# Patient Record
Sex: Female | Born: 1960 | Race: White | Hispanic: No | Smoking: Current every day smoker
Health system: Southern US, Community
[De-identification: ages and names within clinical notes are randomized; demographics above are authoritative.]

## PROBLEM LIST (undated history)

## (undated) DIAGNOSIS — M25519 Pain in unspecified shoulder: Secondary | ICD-10-CM

## (undated) DIAGNOSIS — G629 Polyneuropathy, unspecified: Secondary | ICD-10-CM

## (undated) DIAGNOSIS — M549 Dorsalgia, unspecified: Secondary | ICD-10-CM

## (undated) DIAGNOSIS — E7800 Pure hypercholesterolemia, unspecified: Secondary | ICD-10-CM

## (undated) DIAGNOSIS — R000 Tachycardia, unspecified: Secondary | ICD-10-CM

## (undated) DIAGNOSIS — G8929 Other chronic pain: Secondary | ICD-10-CM

## (undated) HISTORY — PX: SHOULDER SURGERY: SHX246

## (undated) HISTORY — PX: GALLBLADDER SURGERY: SHX652

## (undated) HISTORY — PX: ABDOMINAL HYSTERECTOMY: SHX81

## (undated) HISTORY — PX: BACK SURGERY: SHX140

## (undated) HISTORY — PX: ILIAC ARTERY STENT: SHX1786

---

## 2004-06-20 ENCOUNTER — Emergency Department: Payer: Self-pay | Admitting: Emergency Medicine

## 2004-06-28 ENCOUNTER — Emergency Department: Payer: Self-pay | Admitting: Emergency Medicine

## 2004-08-08 ENCOUNTER — Emergency Department: Payer: Self-pay | Admitting: Emergency Medicine

## 2004-12-15 ENCOUNTER — Emergency Department: Payer: Self-pay | Admitting: Emergency Medicine

## 2004-12-15 ENCOUNTER — Other Ambulatory Visit: Payer: Self-pay

## 2005-02-08 ENCOUNTER — Emergency Department: Payer: Self-pay | Admitting: Internal Medicine

## 2005-03-21 ENCOUNTER — Ambulatory Visit: Payer: Self-pay | Admitting: Unknown Physician Specialty

## 2005-05-04 ENCOUNTER — Other Ambulatory Visit: Payer: Self-pay

## 2005-05-11 ENCOUNTER — Inpatient Hospital Stay: Payer: Self-pay | Admitting: Unknown Physician Specialty

## 2005-05-15 ENCOUNTER — Other Ambulatory Visit: Payer: Self-pay

## 2006-04-08 ENCOUNTER — Emergency Department (HOSPITAL_COMMUNITY): Admission: EM | Admit: 2006-04-08 | Discharge: 2006-04-08 | Payer: Self-pay | Admitting: Emergency Medicine

## 2006-04-13 ENCOUNTER — Ambulatory Visit: Payer: Self-pay | Admitting: General Practice

## 2006-09-18 ENCOUNTER — Emergency Department: Payer: Self-pay | Admitting: Emergency Medicine

## 2006-09-26 ENCOUNTER — Ambulatory Visit: Payer: Self-pay | Admitting: Orthopaedic Surgery

## 2006-11-05 ENCOUNTER — Ambulatory Visit: Payer: Self-pay | Admitting: Unknown Physician Specialty

## 2006-12-19 ENCOUNTER — Ambulatory Visit: Payer: Self-pay | Admitting: Unknown Physician Specialty

## 2006-12-24 ENCOUNTER — Ambulatory Visit: Payer: Self-pay | Admitting: Unknown Physician Specialty

## 2007-06-14 ENCOUNTER — Ambulatory Visit: Payer: Self-pay | Admitting: Unknown Physician Specialty

## 2007-09-12 ENCOUNTER — Ambulatory Visit: Payer: Self-pay | Admitting: General Practice

## 2007-12-02 ENCOUNTER — Ambulatory Visit: Payer: Self-pay | Admitting: Unknown Physician Specialty

## 2008-03-23 ENCOUNTER — Ambulatory Visit: Payer: Self-pay | Admitting: Unknown Physician Specialty

## 2008-03-24 ENCOUNTER — Ambulatory Visit: Payer: Self-pay | Admitting: Unknown Physician Specialty

## 2008-05-28 ENCOUNTER — Ambulatory Visit (HOSPITAL_COMMUNITY): Admission: RE | Admit: 2008-05-28 | Discharge: 2008-05-28 | Payer: Self-pay | Admitting: Obstetrics & Gynecology

## 2008-06-29 ENCOUNTER — Emergency Department (HOSPITAL_COMMUNITY): Admission: EM | Admit: 2008-06-29 | Discharge: 2008-06-29 | Payer: Self-pay | Admitting: Emergency Medicine

## 2008-07-23 ENCOUNTER — Emergency Department (HOSPITAL_COMMUNITY): Admission: EM | Admit: 2008-07-23 | Discharge: 2008-07-23 | Payer: Self-pay | Admitting: Emergency Medicine

## 2008-09-03 ENCOUNTER — Emergency Department (HOSPITAL_COMMUNITY): Admission: EM | Admit: 2008-09-03 | Discharge: 2008-09-03 | Payer: Self-pay | Admitting: Emergency Medicine

## 2008-12-18 ENCOUNTER — Emergency Department: Payer: Self-pay | Admitting: Emergency Medicine

## 2009-01-11 ENCOUNTER — Ambulatory Visit: Payer: Self-pay | Admitting: Cardiology

## 2009-01-13 ENCOUNTER — Ambulatory Visit: Payer: Self-pay | Admitting: Unknown Physician Specialty

## 2009-02-10 ENCOUNTER — Ambulatory Visit: Payer: Self-pay | Admitting: Physician Assistant

## 2009-02-21 ENCOUNTER — Emergency Department: Payer: Self-pay | Admitting: Internal Medicine

## 2009-12-04 ENCOUNTER — Emergency Department (HOSPITAL_COMMUNITY): Admission: EM | Admit: 2009-12-04 | Discharge: 2009-12-04 | Payer: Self-pay | Admitting: Emergency Medicine

## 2010-04-03 ENCOUNTER — Encounter: Payer: Self-pay | Admitting: Obstetrics & Gynecology

## 2010-05-26 LAB — CULTURE, ROUTINE-ABSCESS

## 2010-06-16 ENCOUNTER — Emergency Department (HOSPITAL_COMMUNITY)
Admission: EM | Admit: 2010-06-16 | Discharge: 2010-06-16 | Disposition: A | Discharge status: Home / Self Care | Payer: Medicaid Other | Attending: Emergency Medicine | Admitting: Emergency Medicine

## 2010-06-16 DIAGNOSIS — E7800 Pure hypercholesterolemia, unspecified: Secondary | ICD-10-CM | POA: Insufficient documentation

## 2010-06-16 DIAGNOSIS — I10 Essential (primary) hypertension: Secondary | ICD-10-CM | POA: Insufficient documentation

## 2010-06-16 DIAGNOSIS — Z79899 Other long term (current) drug therapy: Secondary | ICD-10-CM | POA: Insufficient documentation

## 2010-06-16 DIAGNOSIS — E119 Type 2 diabetes mellitus without complications: Secondary | ICD-10-CM | POA: Insufficient documentation

## 2010-06-16 DIAGNOSIS — L03211 Cellulitis of face: Secondary | ICD-10-CM | POA: Insufficient documentation

## 2010-06-16 DIAGNOSIS — L0201 Cutaneous abscess of face: Secondary | ICD-10-CM | POA: Insufficient documentation

## 2010-06-16 DIAGNOSIS — K047 Periapical abscess without sinus: Secondary | ICD-10-CM | POA: Insufficient documentation

## 2010-06-16 LAB — DIFFERENTIAL
Basophils Absolute: 0 10*3/uL (ref 0.0–0.1)
Lymphs Abs: 3.3 10*3/uL (ref 0.7–4.0)
Neutrophils Relative %: 66 % (ref 43–77)

## 2010-06-16 LAB — CBC
MCV: 92.1 fL (ref 78.0–100.0)
RBC: 4.95 MIL/uL (ref 3.87–5.11)
RDW: 13.1 % (ref 11.5–15.5)

## 2010-06-16 LAB — BASIC METABOLIC PANEL
BUN: 10 mg/dL (ref 6–23)
CO2: 30 mEq/L (ref 19–32)
Calcium: 9.9 mg/dL (ref 8.4–10.5)
GFR calc Af Amer: >60 mL/min (ref >60)
GFR calc non Af Amer: >60 mL/min (ref >60)
Glucose, Bld: 336 mg/dL — ABNORMAL HIGH (ref 70–99)
Potassium: 4.6 mEq/L (ref 3.5–5.1)

## 2010-06-21 LAB — GLUCOSE, CAPILLARY: Glucose-Capillary: 248 mg/dL — ABNORMAL HIGH (ref 70–99)

## 2010-06-22 LAB — GLUCOSE, CAPILLARY

## 2010-06-22 LAB — BASIC METABOLIC PANEL
CO2: 29 mEq/L (ref 19–32)
Creatinine, Ser: 0.6 mg/dL (ref 0.4–1.2)
GFR calc Af Amer: >60 mL/min (ref >60)
Potassium: 4 mEq/L (ref 3.5–5.1)
Sodium: 137 mEq/L (ref 135–145)

## 2010-06-22 LAB — DIFFERENTIAL
Eosinophils Absolute: 0.1 10*3/uL (ref 0.0–0.7)
Lymphocytes Relative: 25 % (ref 12–46)

## 2010-06-22 LAB — CBC
Hemoglobin: 16.3 g/dL — ABNORMAL HIGH (ref 12.0–15.0)
MCHC: 34.7 g/dL (ref 30.0–36.0)
Platelets: 238 10*3/uL (ref 150–400)
RDW: 13.5 % (ref 11.5–15.5)
WBC: 13.3 10*3/uL — ABNORMAL HIGH (ref 4.0–10.5)

## 2010-06-22 LAB — POCT CARDIAC MARKERS
CKMB, poc: <1 ng/mL — ABNORMAL LOW (ref 1.0–8.0)
Troponin i, poc: <0.05 ng/mL (ref 0.00–0.09)

## 2010-06-22 LAB — URINALYSIS, ROUTINE W REFLEX MICROSCOPIC
Bilirubin Urine: NEGATIVE
Glucose, UA: 100 mg/dL — AB
Hgb urine dipstick: NEGATIVE
Ketones, ur: 15 mg/dL — AB
Specific Gravity, Urine: >1.03 — ABNORMAL HIGH (ref 1.005–1.030)
Urobilinogen, UA: 0.2 mg/dL (ref 0.0–1.0)

## 2010-06-22 LAB — RAPID URINE DRUG SCREEN, HOSP PERFORMED
Amphetamines: NOT DETECTED
Benzodiazepines: POSITIVE — AB

## 2010-06-22 LAB — ETHANOL: Alcohol, Ethyl (B): 6 mg/dL (ref 0–10)

## 2010-08-27 ENCOUNTER — Emergency Department (HOSPITAL_COMMUNITY)
Admission: EM | Admit: 2010-08-27 | Discharge: 2010-08-27 | Disposition: A | Discharge status: Home / Self Care | Payer: Medicaid Other | Attending: Emergency Medicine | Admitting: Emergency Medicine

## 2010-08-27 DIAGNOSIS — T2017XA Burn of first degree of neck, initial encounter: Secondary | ICD-10-CM | POA: Insufficient documentation

## 2010-08-27 DIAGNOSIS — I10 Essential (primary) hypertension: Secondary | ICD-10-CM | POA: Insufficient documentation

## 2010-08-27 DIAGNOSIS — T2121XA Burn of second degree of chest wall, initial encounter: Secondary | ICD-10-CM | POA: Insufficient documentation

## 2010-08-27 DIAGNOSIS — E7800 Pure hypercholesterolemia, unspecified: Secondary | ICD-10-CM | POA: Insufficient documentation

## 2010-08-27 DIAGNOSIS — X12XXXA Contact with other hot fluids, initial encounter: Secondary | ICD-10-CM | POA: Insufficient documentation

## 2010-08-27 DIAGNOSIS — T2112XA Burn of first degree of abdominal wall, initial encounter: Secondary | ICD-10-CM | POA: Insufficient documentation

## 2010-08-27 DIAGNOSIS — Z794 Long term (current) use of insulin: Secondary | ICD-10-CM | POA: Insufficient documentation

## 2010-08-27 DIAGNOSIS — Y92009 Unspecified place in unspecified non-institutional (private) residence as the place of occurrence of the external cause: Secondary | ICD-10-CM | POA: Insufficient documentation

## 2010-08-27 DIAGNOSIS — L02219 Cutaneous abscess of trunk, unspecified: Secondary | ICD-10-CM | POA: Insufficient documentation

## 2010-08-27 DIAGNOSIS — G589 Mononeuropathy, unspecified: Secondary | ICD-10-CM | POA: Insufficient documentation

## 2010-08-27 DIAGNOSIS — E119 Type 2 diabetes mellitus without complications: Secondary | ICD-10-CM | POA: Insufficient documentation

## 2010-08-27 LAB — GLUCOSE, CAPILLARY: Glucose-Capillary: 190 mg/dL — ABNORMAL HIGH (ref 70–99)

## 2010-09-15 ENCOUNTER — Emergency Department (HOSPITAL_COMMUNITY)
Admission: EM | Admit: 2010-09-15 | Discharge: 2010-09-16 | Disposition: A | Discharge status: Home / Self Care | Payer: Medicaid Other | Attending: Emergency Medicine | Admitting: Emergency Medicine

## 2010-09-15 DIAGNOSIS — I10 Essential (primary) hypertension: Secondary | ICD-10-CM | POA: Insufficient documentation

## 2010-09-15 DIAGNOSIS — R220 Localized swelling, mass and lump, head: Secondary | ICD-10-CM | POA: Insufficient documentation

## 2010-09-15 DIAGNOSIS — Z79899 Other long term (current) drug therapy: Secondary | ICD-10-CM | POA: Insufficient documentation

## 2010-09-15 DIAGNOSIS — T6391XA Toxic effect of contact with unspecified venomous animal, accidental (unintentional), initial encounter: Secondary | ICD-10-CM | POA: Insufficient documentation

## 2010-09-15 DIAGNOSIS — E119 Type 2 diabetes mellitus without complications: Secondary | ICD-10-CM | POA: Insufficient documentation

## 2010-09-15 DIAGNOSIS — G589 Mononeuropathy, unspecified: Secondary | ICD-10-CM | POA: Insufficient documentation

## 2010-09-15 DIAGNOSIS — T63461A Toxic effect of venom of wasps, accidental (unintentional), initial encounter: Secondary | ICD-10-CM | POA: Insufficient documentation

## 2010-09-15 DIAGNOSIS — R51 Headache: Secondary | ICD-10-CM | POA: Insufficient documentation

## 2010-09-26 ENCOUNTER — Encounter: Payer: Self-pay | Admitting: *Deleted

## 2010-09-26 ENCOUNTER — Emergency Department (HOSPITAL_COMMUNITY)
Admission: EM | Admit: 2010-09-26 | Discharge: 2010-09-26 | Disposition: A | Discharge status: Home / Self Care | Payer: Medicaid Other | Attending: Emergency Medicine | Admitting: Emergency Medicine

## 2010-09-26 DIAGNOSIS — E119 Type 2 diabetes mellitus without complications: Secondary | ICD-10-CM | POA: Insufficient documentation

## 2010-09-26 DIAGNOSIS — F17200 Nicotine dependence, unspecified, uncomplicated: Secondary | ICD-10-CM | POA: Insufficient documentation

## 2010-09-26 DIAGNOSIS — T63461A Toxic effect of venom of wasps, accidental (unintentional), initial encounter: Secondary | ICD-10-CM | POA: Insufficient documentation

## 2010-09-26 DIAGNOSIS — IMO0001 Reserved for inherently not codable concepts without codable children: Secondary | ICD-10-CM

## 2010-09-26 DIAGNOSIS — T6391XA Toxic effect of contact with unspecified venomous animal, accidental (unintentional), initial encounter: Secondary | ICD-10-CM | POA: Insufficient documentation

## 2010-09-26 HISTORY — DX: Polyneuropathy, unspecified: G62.9

## 2010-09-26 HISTORY — DX: Pure hypercholesterolemia, unspecified: E78.00

## 2010-09-26 MED ORDER — CEPHALEXIN 500 MG PO CAPS: 500.0000 mg | ORAL_CAPSULE | Freq: Four times a day (QID) | ORAL | Status: AC

## 2010-09-26 MED ORDER — TRAMADOL HCL 50 MG PO TABS: 50.0000 mg | ORAL_TABLET | Freq: Four times a day (QID) | ORAL | Status: AC | PRN

## 2010-09-26 MED ORDER — MORPHINE SULFATE 2 MG/ML IJ SOLN
INTRAMUSCULAR | Status: AC
  Filled 2010-09-26: qty 1

## 2010-09-26 MED ORDER — MORPHINE SULFATE 2 MG/ML IJ SOLN
2.0000 mg | Freq: Once | INTRAMUSCULAR | Status: AC
  Administered 2010-09-26: 2 mg via INTRAMUSCULAR
  Filled 2010-09-26: qty 1

## 2010-09-26 NOTE — ED Provider Notes (Signed)
History     Chief Complaint  Patient presents with  . Insect Bite   Patient is a 50 y.o. female presenting with animal bite. The history is provided by the patient. No language interpreter was used.  Animal Bite  The incident occurred yesterday. The incident occurred at home. She came to the ER via personal transport. Arm Injury Location: Wasp bite/sting to left anterior mid forearm. The pain is mild. Associated symptoms include nausea. Pertinent negatives include no chest pain, no abdominal pain, no vomiting and no weakness. She has been behaving normally.  C/o wasp bite to left anterior mid forearm sustained yesterday with nausea. Notes pain, swelling and redness about the location of the wasp bite. Denies vomiting, diaphoresis, abdominal pain, chest pain, HA, SOB, fever and chills.  Past Medical History  Diagnosis Date  . Diabetes mellitus   . Neuropathy   . High cholesterol     Past Surgical History  Procedure Date  . Back surgery   . Abdominal hysterectomy   . Shoulder surgery     Family History  Problem Relation Age of Onset  . Hypertension Mother   . Diabetes Mother     History  Substance Use Topics  . Smoking status: Current Everyday Smoker  . Smokeless tobacco: Not on file  . Alcohol Use: Yes    OB History    Grav Para Term Preterm Abortions TAB SAB Ect Mult Living                  Review of Systems  Constitutional: Negative for fever and chills.  Cardiovascular: Negative for chest pain.  Gastrointestinal: Positive for nausea. Negative for vomiting and abdominal pain.  Neurological: Negative for weakness.  All other systems reviewed and are negative.  All other systems negative except as noted in HPI.    Physical Exam  BP 109/61  Pulse 84  Temp(Src) 97.5 F (36.4 C) (Oral)  Resp 20  Ht 5\' 5"  (1.651 m)  Wt 162 lb (73.483 kg)  BMI 26.96 kg/m2  SpO2 99%  Physical Exam  Nursing note and vitals reviewed. Constitutional: She is oriented to person,  place, and time. Vital signs are normal. She appears well-developed and well-nourished. No distress.       No distress noted.   HENT:  Head: Normocephalic and atraumatic.  Eyes: Conjunctivae are normal.  Neck: Normal range of motion.  Cardiovascular: Normal rate, regular rhythm and normal pulses.   Pulmonary/Chest: Effort normal.  Musculoskeletal: Normal range of motion.  Neurological: She is alert and oriented to person, place, and time. No sensory deficit.  Skin: Skin is warm, dry and intact.       4cm area of erythematous swelling with tenderness over area with 2mm central pustule.   Psychiatric: She has a normal mood and affect. Her behavior is normal.    ED Course  Procedures  MDM Insect bite to left forearm with local inflammation and small 2 mm pustule.  No immunocompromise.  Not toxic.  No allergies.  Chart written by Danielle Dess for Dr. Weldon Inches  I personally performed the services described in this documentation, which was scribed in my presence. The recorded information has been reviewed and considered.   Nicholes Stairs, MD 09/26/10 [phone]

## 2010-09-26 NOTE — ED Notes (Signed)
Pt was stung by a wasp yesterday on her left forearm. Pt also c/o sunburn.

## 2010-11-17 DIAGNOSIS — E1142 Type 2 diabetes mellitus with diabetic polyneuropathy: Secondary | ICD-10-CM | POA: Insufficient documentation

## 2010-11-17 DIAGNOSIS — E781 Pure hyperglyceridemia: Secondary | ICD-10-CM | POA: Insufficient documentation

## 2010-11-17 DIAGNOSIS — I10 Essential (primary) hypertension: Secondary | ICD-10-CM | POA: Insufficient documentation

## 2010-11-17 DIAGNOSIS — E119 Type 2 diabetes mellitus without complications: Secondary | ICD-10-CM | POA: Insufficient documentation

## 2010-11-17 DIAGNOSIS — F329 Major depressive disorder, single episode, unspecified: Secondary | ICD-10-CM | POA: Insufficient documentation

## 2010-12-15 DIAGNOSIS — G603 Idiopathic progressive neuropathy: Secondary | ICD-10-CM | POA: Insufficient documentation

## 2011-01-12 DIAGNOSIS — I279 Pulmonary heart disease, unspecified: Secondary | ICD-10-CM | POA: Insufficient documentation

## 2012-01-18 ENCOUNTER — Ambulatory Visit (HOSPITAL_COMMUNITY)
Admission: RE | Admit: 2012-01-18 | Discharge: 2012-01-18 | Disposition: A | Discharge status: Home / Self Care | Payer: Medicaid Other | Source: Ambulatory Visit | Attending: Nurse Practitioner | Admitting: Nurse Practitioner

## 2012-01-18 ENCOUNTER — Other Ambulatory Visit (HOSPITAL_COMMUNITY): Payer: Self-pay | Admitting: Nurse Practitioner

## 2012-01-18 DIAGNOSIS — M545 Low back pain, unspecified: Secondary | ICD-10-CM | POA: Insufficient documentation

## 2012-07-02 DIAGNOSIS — I779 Disorder of arteries and arterioles, unspecified: Secondary | ICD-10-CM | POA: Insufficient documentation

## 2012-07-15 DIAGNOSIS — M545 Low back pain, unspecified: Secondary | ICD-10-CM | POA: Insufficient documentation

## 2012-07-15 DIAGNOSIS — G8929 Other chronic pain: Secondary | ICD-10-CM | POA: Insufficient documentation

## 2012-11-19 ENCOUNTER — Emergency Department (HOSPITAL_COMMUNITY)
Admission: EM | Admit: 2012-11-19 | Discharge: 2012-11-19 | Disposition: A | Discharge status: Home / Self Care | Payer: Medicaid Other | Attending: Emergency Medicine | Admitting: Emergency Medicine

## 2012-11-19 ENCOUNTER — Encounter (HOSPITAL_COMMUNITY): Payer: Self-pay | Admitting: *Deleted

## 2012-11-19 ENCOUNTER — Emergency Department (HOSPITAL_COMMUNITY): Payer: Medicaid Other

## 2012-11-19 DIAGNOSIS — Z7982 Long term (current) use of aspirin: Secondary | ICD-10-CM | POA: Insufficient documentation

## 2012-11-19 DIAGNOSIS — J209 Acute bronchitis, unspecified: Secondary | ICD-10-CM | POA: Insufficient documentation

## 2012-11-19 DIAGNOSIS — H9209 Otalgia, unspecified ear: Secondary | ICD-10-CM | POA: Insufficient documentation

## 2012-11-19 DIAGNOSIS — IMO0001 Reserved for inherently not codable concepts without codable children: Secondary | ICD-10-CM | POA: Insufficient documentation

## 2012-11-19 DIAGNOSIS — Z880 Allergy status to penicillin: Secondary | ICD-10-CM | POA: Insufficient documentation

## 2012-11-19 DIAGNOSIS — Z8739 Personal history of other diseases of the musculoskeletal system and connective tissue: Secondary | ICD-10-CM | POA: Insufficient documentation

## 2012-11-19 DIAGNOSIS — Z794 Long term (current) use of insulin: Secondary | ICD-10-CM | POA: Insufficient documentation

## 2012-11-19 DIAGNOSIS — F17200 Nicotine dependence, unspecified, uncomplicated: Secondary | ICD-10-CM | POA: Insufficient documentation

## 2012-11-19 DIAGNOSIS — R300 Dysuria: Secondary | ICD-10-CM | POA: Insufficient documentation

## 2012-11-19 DIAGNOSIS — E119 Type 2 diabetes mellitus without complications: Secondary | ICD-10-CM | POA: Insufficient documentation

## 2012-11-19 DIAGNOSIS — IMO0002 Reserved for concepts with insufficient information to code with codable children: Secondary | ICD-10-CM | POA: Insufficient documentation

## 2012-11-19 DIAGNOSIS — Z79899 Other long term (current) drug therapy: Secondary | ICD-10-CM | POA: Insufficient documentation

## 2012-11-19 DIAGNOSIS — R0602 Shortness of breath: Secondary | ICD-10-CM | POA: Insufficient documentation

## 2012-11-19 DIAGNOSIS — Z7902 Long term (current) use of antithrombotics/antiplatelets: Secondary | ICD-10-CM | POA: Insufficient documentation

## 2012-11-19 DIAGNOSIS — E7800 Pure hypercholesterolemia, unspecified: Secondary | ICD-10-CM | POA: Insufficient documentation

## 2012-11-19 DIAGNOSIS — G8929 Other chronic pain: Secondary | ICD-10-CM | POA: Insufficient documentation

## 2012-11-19 DIAGNOSIS — J40 Bronchitis, not specified as acute or chronic: Secondary | ICD-10-CM

## 2012-11-19 HISTORY — DX: Pain in unspecified shoulder: M25.519

## 2012-11-19 HISTORY — DX: Tachycardia, unspecified: R00.0

## 2012-11-19 HISTORY — DX: Dorsalgia, unspecified: M54.9

## 2012-11-19 HISTORY — DX: Other chronic pain: G89.29

## 2012-11-19 LAB — URINALYSIS, ROUTINE W REFLEX MICROSCOPIC
Bilirubin Urine: NEGATIVE
Leukocytes, UA: NEGATIVE
Nitrite: NEGATIVE
Specific Gravity, Urine: >1.03 — ABNORMAL HIGH (ref 1.005–1.030)
pH: 5.5 (ref 5.0–8.0)

## 2012-11-19 MED ORDER — AZITHROMYCIN 250 MG PO TABS: 250.0000 mg | ORAL_TABLET | Freq: Every day | ORAL | Status: DC

## 2012-11-19 MED ORDER — PREDNISONE 20 MG PO TABS
40.0000 mg | ORAL_TABLET | Freq: Once | ORAL | Status: AC
  Administered 2012-11-19: 40 mg via ORAL
  Filled 2012-11-19: qty 2

## 2012-11-19 MED ORDER — PREDNISONE 20 MG PO TABS: 40.0000 mg | ORAL_TABLET | Freq: Every day | ORAL | Status: DC

## 2012-11-19 MED ORDER — ALBUTEROL SULFATE (5 MG/ML) 0.5% IN NEBU
5.0000 mg | INHALATION_SOLUTION | Freq: Once | RESPIRATORY_TRACT | Status: AC
  Administered 2012-11-19: 5 mg via RESPIRATORY_TRACT
  Filled 2012-11-19: qty 1

## 2012-11-19 MED ORDER — ALBUTEROL SULFATE HFA 108 (90 BASE) MCG/ACT IN AERS
2.0000 | INHALATION_SPRAY | RESPIRATORY_TRACT | Status: AC
  Administered 2012-11-19: 2 via RESPIRATORY_TRACT
  Filled 2012-11-19: qty 6.7

## 2012-11-19 MED ORDER — AZITHROMYCIN 250 MG PO TABS
500.0000 mg | ORAL_TABLET | Freq: Once | ORAL | Status: AC
  Administered 2012-11-19: 500 mg via ORAL
  Filled 2012-11-19: qty 2

## 2012-11-19 NOTE — ED Notes (Signed)
Left in c/o son for transport home; instructions, prescriptions, and f/u information given/reviewed - verbalizes understanding.

## 2012-11-19 NOTE — ED Notes (Signed)
Reports cough, nasal congestion x 1 week.  States cough is sometimes productive of green phlegm. C/o feeling short of breath, but in no apparent distress - respirations even, non-labored, SpO2 96% RA. A&ox4; answers questions appropriately.

## 2012-11-19 NOTE — ED Provider Notes (Signed)
Scribed for Autumn Roller, MD, the patient was seen in room APA11/APA11. This chart was scribed by Autumn Faulkner, ED scribe. Patient's care was started at 1500  CSN: [id number]     Arrival date & time 11/19/12  1421 History   First MD Initiated Contact with Patient 11/19/12 1456     Chief Complaint  Patient presents with  . Shortness of Breath  . Cough   (Consider location/radiation/quality/duration/timing/severity/associated sxs/prior Treatment) The history is provided by the patient and medical records.   HPI Comments: Autumn Faulkner is a 52 y.o. female who presents to the Emergency Department complaining of productive  cough with occasional green sputum onset 2 weeks since grandson's birthday party. Reports associated rhinorrhea with occasional yellow/clear drainage, sinus pressure, right sided otalgia, intermittent wheezing, generalized myalgias, dysuria, and chills. Denies associated fevers. Denies any aggravating or alleviating factors. Reports trying Nyquil, and Robitussin with no relief of symptoms. Reports multiple sick contacts. Reports she smokes cigarettes. Reports PMHx of DM, hyperlipidemia, tachycardia, and iliac artery stent placements. Denies using an inhaler. Denies known PMHx of emphysema and COPD. Past Medical History  Diagnosis Date  . Diabetes mellitus   . Neuropathy   . High cholesterol   . Tachycardia   . Neuropathy   . Chronic back pain   . Chronic shoulder pain   . High cholesterol    Past Surgical History  Procedure Laterality Date  . Back surgery    . Abdominal hysterectomy    . Shoulder surgery    . Iliac artery stent     Family History  Problem Relation Age of Onset  . Hypertension Mother   . Diabetes Mother    History  Substance Use Topics  . Smoking status: Current Every Day Smoker  . Smokeless tobacco: Not on file  . Alcohol Use: Yes   OB History   Grav Para Term Preterm Abortions TAB SAB Ect Mult Living                 Review of  Systems  Constitutional: Negative for fever.  HENT: Positive for ear pain, rhinorrhea and sinus pressure.   Respiratory: Positive for cough and shortness of breath.   Genitourinary: Positive for dysuria.  Musculoskeletal: Positive for myalgias.  Psychiatric/Behavioral: Negative for confusion.  All other systems reviewed and are negative.   A complete 10 system review of systems was obtained and all systems are negative except as noted in the HPI and PMH.   Allergies  Ibuprofen and Penicillins  Home Medications   Current Outpatient Rx  Name  Route  Sig  Dispense  Refill  . ALPRAZolam (XANAX) 1 MG tablet   Oral   Take 0.5-1 mg by mouth 2 (two) times daily as needed for sleep or anxiety (Always takes 1 mg in the evening. May take 0.5 mg during the day .).          Marland Kitchen aspirin EC 81 MG tablet   Oral   Take 81 mg by mouth at bedtime.         Marland Kitchen atenolol (TENORMIN) 50 MG tablet   Oral   Take 50 mg by mouth daily.         Marland Kitchen atorvastatin (LIPITOR) 40 MG tablet   Oral   Take 40 mg by mouth daily.         . clopidogrel (PLAVIX) 75 MG tablet   Oral   Take 75 mg by mouth daily.         Marland Kitchen  Cyanocobalamin (VITAMIN B-12) 1000 MCG SUBL   Sublingual   Place 1,000 mcg under the tongue daily.         Marland Kitchen gabapentin (NEURONTIN) 800 MG tablet   Oral   Take 800-1,600 mg by mouth 3 (three) times daily. Takes 800 mg twice a day and 1600 in the evening.         Marland Kitchen glipiZIDE (GLUCOTROL) 5 MG tablet   Oral   Take 5 mg by mouth 2 (two) times daily before a meal.           . insulin glargine (LANTUS) 100 UNIT/ML injection   Subcutaneous   Inject 60 Units into the skin at bedtime.         . metFORMIN (GLUCOPHAGE) 500 MG tablet   Oral   Take 500 mg by mouth 3 (three) times daily.          . ondansetron (ZOFRAN) 8 MG tablet   Oral   Take 8 mg by mouth every 8 (eight) hours as needed for nausea.         Marland Kitchen oxyCODONE-acetaminophen (PERCOCET) 10-325 MG per tablet   Oral    Take 1 tablet by mouth every 4 (four) hours as needed for pain.         Marland Kitchen azithromycin (ZITHROMAX Z-PAK) 250 MG tablet   Oral   Take 1 tablet (250 mg total) by mouth daily. 500mg  PO day 1, then 250mg  PO days 205   6 tablet   0   . predniSONE (DELTASONE) 20 MG tablet   Oral   Take 2 tablets (40 mg total) by mouth daily.   10 tablet   0    BP 121/53  Pulse 72  Temp(Src) 97.8 F (36.6 C) (Oral)  Resp 22  SpO2 98% Physical Exam  Nursing note and vitals reviewed. Constitutional: She is oriented to person, place, and time. She appears well-developed and well-nourished. No distress.  HENT:  Head: Normocephalic and atraumatic.  Nose: Rhinorrhea (clear discharge) present. Right sinus exhibits maxillary sinus tenderness. Left sinus exhibits maxillary sinus tenderness.  Eyes: Conjunctivae and EOM are normal.  Neck: Neck supple. No tracheal deviation present.  Cardiovascular: Normal rate and regular rhythm.   Pulmonary/Chest: Effort normal. No respiratory distress. She has wheezes (diffuse expiratory).  Musculoskeletal: Normal range of motion.  Lymphadenopathy:    She has no cervical adenopathy.  Neurological: She is alert and oriented to person, place, and time.  Skin: Skin is warm and dry.  Psychiatric: She has a normal mood and affect. Her behavior is normal.    ED Course  Procedures (including critical care time) Medications  albuterol (PROVENTIL HFA;VENTOLIN HFA) 108 (90 BASE) MCG/ACT inhaler 2 puff (not administered)  albuterol (PROVENTIL) (5 MG/ML) 0.5% nebulizer solution 5 mg (5 mg Nebulization Given 11/19/12 1533)  azithromycin (ZITHROMAX) tablet 500 mg (500 mg Oral Given 11/19/12 1514)  predniSONE (DELTASONE) tablet 40 mg (40 mg Oral Given 11/19/12 1514)    Labs Review Labs Reviewed  URINALYSIS, ROUTINE W REFLEX MICROSCOPIC - Abnormal; Notable for the following:    Specific Gravity, Urine >1.030 (*)    All other components within normal limits   Imaging Review Dg  Chest 2 View  11/19/2012   *RADIOLOGY REPORT*  Clinical Data: Cough, congestion, shortness of breath  CHEST - 2 VIEW  Comparison: 08/09/2008  Findings: Lungs are clear. No pleural effusion or pneumothorax.  The heart is normal in size.  Mild degenerative changes of the visualized thoracolumbar spine.  IMPRESSION: No evidence of acute cardiopulmonary disease.   Original Report Authenticated By: Charline Bills, M.D.    MDM   1. Bronchitis    Pt has required neb treatment for SOB and wheezing, she has no fever, tachycardia or hypotension.  i have personally seen and interpretted her xray - there is hyperexpansion but no infiltrates or pneumothroaxes.    She will need abx, MDI for home with spacer and prednisone.    After treatment she has improved but persistent wheezing, speaking in full sentences.  I personally performed the services described in this documentation, which was scribed in my presence. The recorded information has been reviewed and is accurate.      Autumn Roller, MD 11/19/12 [phone]

## 2012-11-19 NOTE — ED Notes (Signed)
Pt reports "I feel better than I have in days".  Denies shortness of breath; denies pain. Okay to discharge per Dr. Hyacinth Meeker.

## 2012-11-19 NOTE — ED Notes (Signed)
Pt c/o sob, cough that is productive with green sputum production, intermittent wheezing and chills for the past two weeks,

## 2012-12-13 ENCOUNTER — Encounter (HOSPITAL_COMMUNITY): Payer: Self-pay | Admitting: *Deleted

## 2012-12-13 ENCOUNTER — Emergency Department (HOSPITAL_COMMUNITY): Payer: Medicaid Other

## 2012-12-13 ENCOUNTER — Inpatient Hospital Stay (HOSPITAL_COMMUNITY)
Admission: EM | Admit: 2012-12-13 | Discharge: 2012-12-15 | DRG: 192 | Disposition: A | Discharge status: Home / Self Care | Payer: Medicaid Other | Attending: Internal Medicine | Admitting: Internal Medicine

## 2012-12-13 DIAGNOSIS — R739 Hyperglycemia, unspecified: Secondary | ICD-10-CM | POA: Diagnosis present

## 2012-12-13 DIAGNOSIS — E1165 Type 2 diabetes mellitus with hyperglycemia: Secondary | ICD-10-CM

## 2012-12-13 DIAGNOSIS — E669 Obesity, unspecified: Secondary | ICD-10-CM | POA: Diagnosis present

## 2012-12-13 DIAGNOSIS — Z720 Tobacco use: Secondary | ICD-10-CM

## 2012-12-13 DIAGNOSIS — E7800 Pure hypercholesterolemia, unspecified: Secondary | ICD-10-CM | POA: Diagnosis present

## 2012-12-13 DIAGNOSIS — F17200 Nicotine dependence, unspecified, uncomplicated: Secondary | ICD-10-CM | POA: Diagnosis present

## 2012-12-13 DIAGNOSIS — E1142 Type 2 diabetes mellitus with diabetic polyneuropathy: Secondary | ICD-10-CM | POA: Diagnosis present

## 2012-12-13 DIAGNOSIS — E1149 Type 2 diabetes mellitus with other diabetic neurological complication: Secondary | ICD-10-CM | POA: Diagnosis present

## 2012-12-13 DIAGNOSIS — J441 Chronic obstructive pulmonary disease with (acute) exacerbation: Principal | ICD-10-CM | POA: Diagnosis present

## 2012-12-13 DIAGNOSIS — Z8249 Family history of ischemic heart disease and other diseases of the circulatory system: Secondary | ICD-10-CM

## 2012-12-13 DIAGNOSIS — G894 Chronic pain syndrome: Secondary | ICD-10-CM

## 2012-12-13 DIAGNOSIS — T380X5A Adverse effect of glucocorticoids and synthetic analogues, initial encounter: Secondary | ICD-10-CM | POA: Diagnosis present

## 2012-12-13 DIAGNOSIS — Z79899 Other long term (current) drug therapy: Secondary | ICD-10-CM

## 2012-12-13 DIAGNOSIS — Z833 Family history of diabetes mellitus: Secondary | ICD-10-CM

## 2012-12-13 DIAGNOSIS — G8929 Other chronic pain: Secondary | ICD-10-CM | POA: Diagnosis present

## 2012-12-13 DIAGNOSIS — E1140 Type 2 diabetes mellitus with diabetic neuropathy, unspecified: Secondary | ICD-10-CM

## 2012-12-13 LAB — BASIC METABOLIC PANEL
Calcium: 9.4 mg/dL (ref 8.4–10.5)
GFR calc non Af Amer: >90 mL/min (ref >90)
Sodium: 137 mEq/L (ref 135–145)

## 2012-12-13 LAB — GLUCOSE, CAPILLARY

## 2012-12-13 MED ORDER — ALBUTEROL SULFATE (5 MG/ML) 0.5% IN NEBU
5.0000 mg | INHALATION_SOLUTION | Freq: Once | RESPIRATORY_TRACT | Status: AC
  Administered 2012-12-13: 5 mg via RESPIRATORY_TRACT
  Filled 2012-12-13: qty 1

## 2012-12-13 MED ORDER — OXYCODONE-ACETAMINOPHEN 5-325 MG PO TABS
1.0000 | ORAL_TABLET | Freq: Once | ORAL | Status: AC
  Administered 2012-12-13: 1 via ORAL
  Filled 2012-12-13: qty 1

## 2012-12-13 MED ORDER — IPRATROPIUM BROMIDE 0.02 % IN SOLN
0.5000 mg | Freq: Once | RESPIRATORY_TRACT | Status: AC
  Administered 2012-12-13: 0.5 mg via RESPIRATORY_TRACT
  Filled 2012-12-13: qty 2.5

## 2012-12-13 MED ORDER — ALBUTEROL SULFATE (5 MG/ML) 0.5% IN NEBU
10.0000 mg | INHALATION_SOLUTION | Freq: Once | RESPIRATORY_TRACT | Status: AC
  Administered 2012-12-13: 10 mg via RESPIRATORY_TRACT
  Filled 2012-12-13: qty 2

## 2012-12-13 MED ORDER — LEVOFLOXACIN 500 MG PO TABS
500.0000 mg | ORAL_TABLET | Freq: Once | ORAL | Status: AC
  Administered 2012-12-13: 500 mg via ORAL
  Filled 2012-12-13: qty 1

## 2012-12-13 MED ORDER — PREDNISONE 50 MG PO TABS
60.0000 mg | ORAL_TABLET | Freq: Once | ORAL | Status: AC
  Administered 2012-12-13: 60 mg via ORAL
  Filled 2012-12-13 (×2): qty 1

## 2012-12-13 NOTE — ED Notes (Signed)
Pulse 84, O2 sat 99%.

## 2012-12-13 NOTE — ED Notes (Signed)
Family at the bedside.

## 2012-12-13 NOTE — ED Notes (Signed)
Cough, green sputum, nasal congestion, cough

## 2012-12-13 NOTE — ED Notes (Signed)
Pt on continuous neb tx. O2 98%, Pulse 94. Pt tolerating well, resting at present.

## 2012-12-14 DIAGNOSIS — G894 Chronic pain syndrome: Secondary | ICD-10-CM | POA: Diagnosis present

## 2012-12-14 DIAGNOSIS — Z720 Tobacco use: Secondary | ICD-10-CM | POA: Diagnosis present

## 2012-12-14 DIAGNOSIS — F17200 Nicotine dependence, unspecified, uncomplicated: Secondary | ICD-10-CM

## 2012-12-14 DIAGNOSIS — E1140 Type 2 diabetes mellitus with diabetic neuropathy, unspecified: Secondary | ICD-10-CM | POA: Diagnosis present

## 2012-12-14 DIAGNOSIS — J441 Chronic obstructive pulmonary disease with (acute) exacerbation: Secondary | ICD-10-CM | POA: Diagnosis present

## 2012-12-14 DIAGNOSIS — E1165 Type 2 diabetes mellitus with hyperglycemia: Secondary | ICD-10-CM | POA: Diagnosis present

## 2012-12-14 DIAGNOSIS — R739 Hyperglycemia, unspecified: Secondary | ICD-10-CM | POA: Diagnosis present

## 2012-12-14 LAB — URINALYSIS, ROUTINE W REFLEX MICROSCOPIC
Bilirubin Urine: NEGATIVE
Hgb urine dipstick: NEGATIVE
Leukocytes, UA: NEGATIVE
Nitrite: NEGATIVE
Protein, ur: NEGATIVE mg/dL
Specific Gravity, Urine: 1.025 (ref 1.005–1.030)
pH: 6 (ref 5.0–8.0)

## 2012-12-14 LAB — BASIC METABOLIC PANEL
BUN: 17 mg/dL (ref 6–23)
Calcium: 9.4 mg/dL (ref 8.4–10.5)
Chloride: 97 mEq/L (ref 96–112)
GFR calc Af Amer: >90 mL/min (ref >90)
Glucose, Bld: 418 mg/dL — ABNORMAL HIGH (ref 70–99)
Potassium: 4.4 mEq/L (ref 3.5–5.1)
Sodium: 137 mEq/L (ref 135–145)

## 2012-12-14 LAB — GLUCOSE, CAPILLARY
Glucose-Capillary: 381 mg/dL — ABNORMAL HIGH (ref 70–99)
Glucose-Capillary: 389 mg/dL — ABNORMAL HIGH (ref 70–99)
Glucose-Capillary: 269 mg/dL — ABNORMAL HIGH (ref 70–99)

## 2012-12-14 LAB — CBC WITH DIFFERENTIAL/PLATELET
Eosinophils Absolute: 0.2 10*3/uL (ref 0.0–0.7)
Lymphocytes Relative: 36 % (ref 12–46)
Lymphs Abs: 3.7 10*3/uL (ref 0.7–4.0)
Neutrophils Relative %: 57 % (ref 43–77)
Platelets: 261 10*3/uL (ref 150–400)
RBC: 4.88 MIL/uL (ref 3.87–5.11)
WBC: 10.4 10*3/uL (ref 4.0–10.5)

## 2012-12-14 LAB — CBC
HCT: 44.5 % (ref 36.0–46.0)
Hemoglobin: 14.7 g/dL (ref 12.0–15.0)
MCHC: 33 g/dL (ref 30.0–36.0)
RBC: 4.78 MIL/uL (ref 3.87–5.11)
WBC: 10.2 10*3/uL (ref 4.0–10.5)

## 2012-12-14 LAB — HEMOGLOBIN A1C: Mean Plasma Glucose: 249 mg/dL — ABNORMAL HIGH (ref <117)

## 2012-12-14 LAB — URINE MICROSCOPIC-ADD ON

## 2012-12-14 MED ORDER — IPRATROPIUM BROMIDE 0.02 % IN SOLN
0.5000 mg | RESPIRATORY_TRACT | Status: DC
  Administered 2012-12-14 – 2012-12-15 (×8): 0.5 mg via RESPIRATORY_TRACT
  Filled 2012-12-14 (×8): qty 2.5

## 2012-12-14 MED ORDER — OXYCODONE HCL 5 MG PO TABS
5.0000 mg | ORAL_TABLET | ORAL | Status: DC | PRN
  Administered 2012-12-14 – 2012-12-15 (×3): 5 mg via ORAL
  Filled 2012-12-14 (×3): qty 1

## 2012-12-14 MED ORDER — ACETAMINOPHEN 325 MG PO TABS: 650.0000 mg | ORAL_TABLET | ORAL | Status: DC | PRN

## 2012-12-14 MED ORDER — GLIPIZIDE 5 MG PO TABS
5.0000 mg | ORAL_TABLET | Freq: Two times a day (BID) | ORAL | Status: DC
  Administered 2012-12-14 – 2012-12-15 (×3): 5 mg via ORAL
  Filled 2012-12-14 (×3): qty 1

## 2012-12-14 MED ORDER — GUAIFENESIN ER 600 MG PO TB12
1200.0000 mg | ORAL_TABLET | Freq: Two times a day (BID) | ORAL | Status: DC
  Administered 2012-12-14 – 2012-12-15 (×3): 1200 mg via ORAL
  Filled 2012-12-14 (×3): qty 2

## 2012-12-14 MED ORDER — ALBUTEROL SULFATE (5 MG/ML) 0.5% IN NEBU: 2.5000 mg | INHALATION_SOLUTION | RESPIRATORY_TRACT | Status: DC | PRN

## 2012-12-14 MED ORDER — OXYCODONE HCL 5 MG PO TABS
5.0000 mg | ORAL_TABLET | ORAL | Status: DC | PRN
  Administered 2012-12-14: 5 mg via ORAL
  Filled 2012-12-14: qty 1

## 2012-12-14 MED ORDER — INSULIN ASPART 100 UNIT/ML ~~LOC~~ SOLN
0.0000 [IU] | Freq: Three times a day (TID) | SUBCUTANEOUS | Status: DC
  Administered 2012-12-14: 15 [IU] via SUBCUTANEOUS
  Administered 2012-12-14 – 2012-12-15 (×3): 8 [IU] via SUBCUTANEOUS

## 2012-12-14 MED ORDER — METFORMIN HCL 500 MG PO TABS
500.0000 mg | ORAL_TABLET | Freq: Three times a day (TID) | ORAL | Status: DC
  Administered 2012-12-14 – 2012-12-15 (×4): 500 mg via ORAL
  Filled 2012-12-14 (×4): qty 1

## 2012-12-14 MED ORDER — LEVOFLOXACIN 500 MG PO TABS
500.0000 mg | ORAL_TABLET | Freq: Every day | ORAL | Status: DC
  Administered 2012-12-14 – 2012-12-15 (×2): 500 mg via ORAL
  Filled 2012-12-14 (×2): qty 1

## 2012-12-14 MED ORDER — PREDNISONE 20 MG PO TABS
40.0000 mg | ORAL_TABLET | Freq: Every day | ORAL | Status: DC
  Administered 2012-12-15: 40 mg via ORAL
  Filled 2012-12-14: qty 2

## 2012-12-14 MED ORDER — ONDANSETRON HCL 4 MG/2ML IJ SOLN: 4.0000 mg | Freq: Three times a day (TID) | INTRAMUSCULAR | Status: DC | PRN

## 2012-12-14 MED ORDER — ATORVASTATIN CALCIUM 40 MG PO TABS
40.0000 mg | ORAL_TABLET | Freq: Every day | ORAL | Status: DC
  Administered 2012-12-14: 40 mg via ORAL
  Filled 2012-12-14: qty 1

## 2012-12-14 MED ORDER — ASPIRIN EC 81 MG PO TBEC
81.0000 mg | DELAYED_RELEASE_TABLET | Freq: Every day | ORAL | Status: DC
  Administered 2012-12-14: 81 mg via ORAL
  Filled 2012-12-14: qty 1

## 2012-12-14 MED ORDER — INSULIN GLARGINE 100 UNIT/ML ~~LOC~~ SOLN
60.0000 [IU] | Freq: Every day | SUBCUTANEOUS | Status: DC
  Administered 2012-12-14: 60 [IU] via SUBCUTANEOUS
  Filled 2012-12-14 (×2): qty 0.6

## 2012-12-14 MED ORDER — ATENOLOL 25 MG PO TABS
50.0000 mg | ORAL_TABLET | Freq: Every day | ORAL | Status: DC
  Administered 2012-12-14 – 2012-12-15 (×2): 50 mg via ORAL
  Filled 2012-12-14 (×2): qty 2

## 2012-12-14 MED ORDER — GABAPENTIN 400 MG PO CAPS
800.0000 mg | ORAL_CAPSULE | Freq: Two times a day (BID) | ORAL | Status: DC
  Administered 2012-12-14 – 2012-12-15 (×3): 800 mg via ORAL
  Filled 2012-12-14 (×3): qty 2

## 2012-12-14 MED ORDER — ENOXAPARIN SODIUM 40 MG/0.4ML ~~LOC~~ SOLN
40.0000 mg | SUBCUTANEOUS | Status: DC
  Administered 2012-12-14 – 2012-12-15 (×2): 40 mg via SUBCUTANEOUS
  Filled 2012-12-14 (×2): qty 0.4

## 2012-12-14 MED ORDER — OXYCODONE-ACETAMINOPHEN 5-325 MG PO TABS
1.0000 | ORAL_TABLET | ORAL | Status: DC | PRN
  Administered 2012-12-14 – 2012-12-15 (×3): 1 via ORAL
  Filled 2012-12-14 (×3): qty 1

## 2012-12-14 MED ORDER — OXYCODONE-ACETAMINOPHEN 10-325 MG PO TABS: 1.0000 | ORAL_TABLET | ORAL | Status: DC | PRN

## 2012-12-14 MED ORDER — ALBUTEROL SULFATE (5 MG/ML) 0.5% IN NEBU
2.5000 mg | INHALATION_SOLUTION | RESPIRATORY_TRACT | Status: DC
  Administered 2012-12-14 – 2012-12-15 (×7): 2.5 mg via RESPIRATORY_TRACT
  Filled 2012-12-14 (×8): qty 0.5

## 2012-12-14 MED ORDER — IPRATROPIUM BROMIDE 0.02 % IN SOLN
RESPIRATORY_TRACT | Status: AC
  Filled 2012-12-14: qty 2.5

## 2012-12-14 MED ORDER — CLOPIDOGREL BISULFATE 75 MG PO TABS
75.0000 mg | ORAL_TABLET | Freq: Every day | ORAL | Status: DC
  Administered 2012-12-14 – 2012-12-15 (×2): 75 mg via ORAL
  Filled 2012-12-14 (×2): qty 1

## 2012-12-14 MED ORDER — PREDNISONE 10 MG PO TABS
50.0000 mg | ORAL_TABLET | Freq: Every day | ORAL | Status: DC
  Administered 2012-12-14: 50 mg via ORAL
  Filled 2012-12-14: qty 2
  Filled 2012-12-14: qty 1

## 2012-12-14 MED ORDER — NICOTINE 21 MG/24HR TD PT24
21.0000 mg | MEDICATED_PATCH | Freq: Every day | TRANSDERMAL | Status: DC
  Administered 2012-12-14 – 2012-12-15 (×2): 21 mg via TRANSDERMAL
  Filled 2012-12-14 (×2): qty 1

## 2012-12-14 MED ORDER — ALBUTEROL SULFATE (5 MG/ML) 0.5% IN NEBU: 2.5000 mg | INHALATION_SOLUTION | RESPIRATORY_TRACT | Status: DC

## 2012-12-14 MED ORDER — ALBUTEROL SULFATE (5 MG/ML) 0.5% IN NEBU
INHALATION_SOLUTION | RESPIRATORY_TRACT | Status: AC
  Administered 2012-12-14: 2.5 mg
  Filled 2012-12-14: qty 0.5

## 2012-12-14 MED ORDER — HYDROMORPHONE HCL PF 1 MG/ML IJ SOLN: 0.5000 mg | INTRAMUSCULAR | Status: DC | PRN

## 2012-12-14 MED ORDER — GABAPENTIN 400 MG PO CAPS
1600.0000 mg | ORAL_CAPSULE | Freq: Every day | ORAL | Status: DC
  Administered 2012-12-14: 1600 mg via ORAL
  Filled 2012-12-14: qty 4

## 2012-12-14 MED ORDER — POTASSIUM CHLORIDE IN NACL 20-0.9 MEQ/L-% IV SOLN
INTRAVENOUS | Status: DC
  Administered 2012-12-14 – 2012-12-15 (×3): via INTRAVENOUS

## 2012-12-14 MED ORDER — FLEET ENEMA 7-19 GM/118ML RE ENEM: 1.0000 | ENEMA | Freq: Once | RECTAL | Status: AC | PRN

## 2012-12-14 MED ORDER — INSULIN ASPART 100 UNIT/ML ~~LOC~~ SOLN
0.0000 [IU] | Freq: Every day | SUBCUTANEOUS | Status: DC
  Administered 2012-12-14: 3 [IU] via SUBCUTANEOUS

## 2012-12-14 MED ORDER — ALPRAZOLAM 0.5 MG PO TABS
0.5000 mg | ORAL_TABLET | Freq: Two times a day (BID) | ORAL | Status: DC | PRN
  Administered 2012-12-14: 1 mg via ORAL
  Filled 2012-12-14 (×2): qty 1

## 2012-12-14 NOTE — ED Provider Notes (Signed)
CSN: [id number]     Arrival date & time 12/13/12  2010 History   First MD Initiated Contact with Patient 12/13/12 2035     Chief Complaint  Patient presents with  . Cough   (Consider location/radiation/quality/duration/timing/severity/associated sxs/prior Treatment) HPI Comments: Autumn Faulkner is a 52 y.o. Female presenting with persistent cough,  Wheezing,  Shortness of breath along with nasal congestion and subjective fevers for the past month.  She has been evaluated for this condition twice now, the first time here on 11/19/12 at which time she was treated with zithromax,  Albuterol mdi and prednisone which briefly improved her symptoms.  She had another course of zithromax prescribed by her pcp (in [place])  Which she completed 10 days ago.  Her symptoms have escalated since finishing this second course of zithromax.  She describes shortness of breath and persistent wheezing which is worse with ambulation.  She has been a heavy smoker,  Reporting a 2 ppd history until last month, and has weaned down to "less than 1 ppd" currently.  She denies chest pain, abdominal pain, nausea and vomiting.  ,      The history is provided by the patient.    Past Medical History  Diagnosis Date  . Diabetes mellitus   . Neuropathy   . High cholesterol   . Tachycardia   . Neuropathy   . Chronic back pain   . Chronic shoulder pain   . High cholesterol    Past Surgical History  Procedure Laterality Date  . Back surgery    . Abdominal hysterectomy    . Shoulder surgery    . Iliac artery stent     Family History  Problem Relation Age of Onset  . Hypertension Mother   . Diabetes Mother    History  Substance Use Topics  . Smoking status: Current Every Day Smoker  . Smokeless tobacco: Not on file  . Alcohol Use: Yes   OB History   Grav Para Term Preterm Abortions TAB SAB Ect Mult Living                 Review of Systems  Constitutional: Positive for fever and fatigue.  HENT:  Positive for congestion. Negative for sore throat and neck pain.   Eyes: Negative.   Respiratory: Positive for cough, shortness of breath and wheezing. Negative for chest tightness.   Cardiovascular: Negative for chest pain.  Gastrointestinal: Negative for nausea and abdominal pain.  Genitourinary: Negative.   Musculoskeletal: Negative for joint swelling and arthralgias.  Skin: Negative.  Negative for rash and wound.  Neurological: Negative for dizziness, weakness, light-headedness, numbness and headaches.  Psychiatric/Behavioral: Negative.     Allergies  Penicillins  Home Medications   Current Outpatient Rx  Name  Route  Sig  Dispense  Refill  . ALPRAZolam (XANAX) 1 MG tablet   Oral   Take 0.5-1 mg by mouth 2 (two) times daily as needed for sleep or anxiety (Always takes 1 mg in the evening. May take 0.5 mg during the day .).          Marland Kitchen aspirin EC 81 MG tablet   Oral   Take 81 mg by mouth at bedtime.         Marland Kitchen atenolol (TENORMIN) 50 MG tablet   Oral   Take 50 mg by mouth daily.         Marland Kitchen atorvastatin (LIPITOR) 40 MG tablet   Oral   Take 40 mg by mouth daily.         Marland Kitchen  clopidogrel (PLAVIX) 75 MG tablet   Oral   Take 75 mg by mouth daily.         . Cyanocobalamin (VITAMIN B-12) 1000 MCG SUBL   Sublingual   Place 1,000 mcg under the tongue daily.         Marland Kitchen gabapentin (NEURONTIN) 800 MG tablet   Oral   Take 800-1,600 mg by mouth 3 (three) times daily. Takes 800 mg twice a day and 1600 in the evening.         Marland Kitchen glipiZIDE (GLUCOTROL) 5 MG tablet   Oral   Take 5 mg by mouth 2 (two) times daily before a meal.           . insulin glargine (LANTUS) 100 UNIT/ML injection   Subcutaneous   Inject 60 Units into the skin at bedtime.         . metFORMIN (GLUCOPHAGE) 500 MG tablet   Oral   Take 500 mg by mouth 3 (three) times daily.          . ondansetron (ZOFRAN) 8 MG tablet   Oral   Take 8 mg by mouth every 8 (eight) hours as needed for nausea.          Marland Kitchen oxyCODONE-acetaminophen (PERCOCET) 10-325 MG per tablet   Oral   Take 1 tablet by mouth every 4 (four) hours as needed for pain.         . predniSONE (DELTASONE) 20 MG tablet   Oral   Take 2 tablets (40 mg total) by mouth daily.   10 tablet   0    BP 130/70  Pulse 95  Temp(Src) 98.5 F (36.9 C) (Oral)  Resp 24  Ht 5\' 5"  (1.651 m)  Wt 180 lb (81.647 kg)  BMI 29.95 kg/m2  SpO2 88% Physical Exam  Nursing note and vitals reviewed. Constitutional: She appears well-developed and well-nourished.  HENT:  Head: Normocephalic and atraumatic.  Eyes: Conjunctivae are normal.  Neck: Normal range of motion.  Cardiovascular: Normal rate, regular rhythm, normal heart sounds and intact distal pulses.   Pulmonary/Chest: Tachypnea noted. She is in respiratory distress. She has wheezes. She has no rhonchi.  Expiratory wheeze throughout all lung fields, markedly diminished breath sounds right base.  Prolonged expirations.  Abdominal: Soft. Bowel sounds are normal. There is no tenderness.  Musculoskeletal: Normal range of motion.  Neurological: She is alert.  Skin: Skin is warm and dry.  Psychiatric: She has a normal mood and affect.    ED Course  Procedures (including critical care time) Labs Review Labs Reviewed  GLUCOSE, CAPILLARY - Abnormal; Notable for the following:    Glucose-Capillary 324 (*)    All other components within normal limits  BASIC METABOLIC PANEL - Abnormal; Notable for the following:    Glucose, Bld 305 (*)    All other components within normal limits  CBC WITH DIFFERENTIAL   Imaging Review Dg Chest 2 View  12/13/2012   CLINICAL DATA:  Cough and fever  EXAM: CHEST  2 VIEW  COMPARISON:  11/19/2012  FINDINGS: The heart size and mediastinal contours are within normal limits. Both lungs are clear. The visualized skeletal structures are unremarkable.  IMPRESSION: No active cardiopulmonary disease.   Electronically Signed   By: Alcide Clever M.D.   On: 12/13/2012  21:33    Medications  albuterol (PROVENTIL) (5 MG/ML) 0.5% nebulizer solution 5 mg (5 mg Nebulization Given 12/13/12 2059)  ipratropium (ATROVENT) nebulizer solution 0.5 mg (0.5 mg Nebulization Given 12/13/12  2059)  predniSONE (DELTASONE) tablet 60 mg (60 mg Oral Given 12/13/12 2055)  albuterol (PROVENTIL) (5 MG/ML) 0.5% nebulizer solution 10 mg (10 mg Nebulization Given 12/13/12 2203)  levofloxacin (LEVAQUIN) tablet 500 mg (500 mg Oral Given 12/13/12 2324)  oxyCODONE-acetaminophen (PERCOCET/ROXICET) 5-325 MG per tablet 1 tablet (1 tablet Oral Given 12/13/12 2324)     MDM   1. Bronchitis with bronchospasm   2. Hyperglycemia without ketosis      Pt had slightly improved aeration with persistent expiratory wheezing after albuterol/atrovent neb.  Prednisone 60 mg also given.  She was treated with a 10 mg neb over 1 hour.  At re-exam,  She she had improved aeration throughout lung fields,  But continued to have wheezing.  She was ambulated in dept with pulse ox dropping to 85%,developing  Significantly increased sob.  Will arrange admission, patient agrees with this plan.      Burgess Amor, PA-C 12/14/12 [phone]

## 2012-12-14 NOTE — Progress Notes (Signed)
  Autumn Faulkner ZOX:[id number] DOB: [birth year] DOA: 12/13/2012 PCP: Rikki Spearing, FNP   Subjective: This lady was admitted yesterday with an exacerbation of her COPD. She continues to smoke one pack of cigarettes per day, although she was smoking 2 packs of cigarettes per day almost a month ago. She feels somewhat better than admission yesterday.          Physical Exam: Blood pressure 104/68, pulse 86, temperature 97 F (36.1 C), temperature source Oral, resp. rate 20, height 5\' 5"  (1.651 m), weight 81.647 kg (180 lb), SpO2 95.00%. She looks systemically well. She is not toxic or septic. There is no increased work of breathing. She has bilateral wheezing but her chest is not tight. Heart sounds are present without murmurs or added sounds. She is alert and orientated.        Basic Metabolic Panel:  Recent Labs  81/19/14 2116 12/14/12 0426  NA 137 137  K 4.1 4.4  CL 97 97  CO2 32 27  GLUCOSE 305* 418*  BUN 17 17  CREATININE 0.70 0.56  CALCIUM 9.4 9.4      CBC:  Recent Labs  12/13/12 2116 12/14/12 0426  WBC 10.4 10.2  NEUTROABS 5.9  --   HGB 15.1* 14.7  HCT 45.4 44.5  MCV 93.0 93.1  PLT 261 240    Dg Chest 2 View  12/13/2012   CLINICAL DATA:  Cough and fever  EXAM: CHEST  2 VIEW  COMPARISON:  11/19/2012  FINDINGS: The heart size and mediastinal contours are within normal limits. Both lungs are clear. The visualized skeletal structures are unremarkable.  IMPRESSION: No active cardiopulmonary disease.   Electronically Signed   By: Alcide Clever M.D.   On: 12/13/2012 21:33      Medications: I have reviewed the patient's current medications.  Impression: 1. COPD exacerbation. 2. Ongoing tobacco abuse. 3. Type 2 diabetes mellitus, uncontrolled, exacerbated by steroids. 4. Chronic pain disorder.     Plan: 1. Continue with oral steroids. 2. Add oral antibiotics. 3. Wean off oxygen to see if she will tolerate room  air.  Consultants:  None.   Procedures:  None.   Antibiotics:  Oral Levaquin started 12/14/2012.                   Code Status: Full code.  Family Communication: Discussed plan with patient at the bedside.   Disposition Plan: Home when medically stable.  Time spent: 30 minutes.   LOS: 1 day   Wilson Singer Pager [phone]  12/14/2012, 10:48 AM

## 2012-12-14 NOTE — ED Provider Notes (Signed)
Medical screening examination/treatment/procedure(s) were performed by non-physician practitioner and as supervising physician I was immediately available for consultation/collaboration.  Donnetta Hutching, MD 12/14/12 [phone]

## 2012-12-14 NOTE — ED Notes (Signed)
Pt 02 dropped to 85% when ambulating. Pt continues to be SOB and sat 89 -90 % resting. Pt put on 2L 02. IV started

## 2012-12-14 NOTE — H&P (Signed)
[facility] History and Physical  Autumn Faulkner  EAV:[id number]  DOB: [birth year]   DOA:12/14/2012   PCP:   Rikki Spearing, FNP   Chief Complaint:  Recurrent wheezing  HPI: Autumn Faulkner is a 52 y.o. female.   Heavy tobacco use, Presents with her third episode of bronchitis in 4 week  She did not settle down sufficiently after nebulizations in the emergency room and hospitalist service was called  Rewiew of Systems:   All systems negative except as marked bold or noted in the HPI;  Constitutional:    malaise, fever and chills. ;  Eyes:   eye pain, redness and discharge. ;  ENMT:   ear pain, hoarseness, nasal congestion, sinus pressure and sore throat. ;  Cardiovascular:    chest pain, palpitations, diaphoresis, dyspnea and peripheral edema.  Respiratory:   cough, hemoptysis, wheezing and stridor. ;  Gastrointestinal:  nausea, vomiting, diarrhea, constipation, abdominal pain, melena, blood in stool, hematemesis, jaundice and rectal bleeding. unusual weight loss..   Genitourinary:    frequency, dysuria, incontinence,flank pain and hematuria; Musculoskeletal:   back pain and neck pain.  swelling and trauma.;  Skin: .  pruritus, rash, abrasions, bruising and skin lesion.; ulcerations Neuro:    headache, lightheadedness and neck stiffness.  weakness, altered level of consciousness, altered mental status, extremity weakness, burning feet, involuntary movement, seizure and syncope.  Psych:    anxiety, depression, insomnia, tearfulness, panic attacks, hallucinations, paranoia, suicidal or homicidal ideation    Past Medical History  Diagnosis Date  . Diabetes mellitus   . Neuropathy   . High cholesterol   . Tachycardia   . Neuropathy   . Chronic back pain   . Chronic shoulder pain   . High cholesterol     Past Surgical History  Procedure Laterality Date  . Back surgery    . Abdominal hysterectomy    . Shoulder surgery    . Iliac artery stent      Medications:  HOME  MEDS: Prior to Admission medications   Medication Sig Start Date End Date Taking? Authorizing Provider  ALPRAZolam Prudy Feeler) 1 MG tablet Take 0.5-1 mg by mouth 2 (two) times daily as needed for sleep or anxiety (Always takes 1 mg in the evening. May take 0.5 mg during the day .).    Yes Historical Provider, MD  aspirin EC 81 MG tablet Take 81 mg by mouth at bedtime.   Yes Historical Provider, MD  atenolol (TENORMIN) 50 MG tablet Take 50 mg by mouth daily.   Yes Historical Provider, MD  atorvastatin (LIPITOR) 40 MG tablet Take 40 mg by mouth daily.   Yes Historical Provider, MD  clopidogrel (PLAVIX) 75 MG tablet Take 75 mg by mouth daily.   Yes Historical Provider, MD  Cyanocobalamin (VITAMIN B-12) 1000 MCG SUBL Place 1,000 mcg under the tongue daily.   Yes Historical Provider, MD  gabapentin (NEURONTIN) 800 MG tablet Take 800-1,600 mg by mouth 3 (three) times daily. Takes 800 mg twice a day and 1600 in the evening.   Yes Historical Provider, MD  glipiZIDE (GLUCOTROL) 5 MG tablet Take 5 mg by mouth 2 (two) times daily before a meal.     Yes Historical Provider, MD  insulin glargine (LANTUS) 100 UNIT/ML injection Inject 60 Units into the skin at bedtime.   Yes Historical Provider, MD  metFORMIN (GLUCOPHAGE) 500 MG tablet Take 500 mg by mouth 3 (three) times daily.    Yes Historical Provider, MD  ondansetron (ZOFRAN) 8 MG tablet  Take 8 mg by mouth every 8 (eight) hours as needed for nausea.   Yes Historical Provider, MD  oxyCODONE-acetaminophen (PERCOCET) 10-325 MG per tablet Take 1 tablet by mouth every 4 (four) hours as needed for pain.   Yes Historical Provider, MD  predniSONE (DELTASONE) 20 MG tablet Take 2 tablets (40 mg total) by mouth daily. 11/19/12  Yes [name], MD     Allergies:  Allergies  Allergen Reactions  . Penicillins Other (See Comments)    Unknown     Social History:   reports that she has been smoking.  She does not have any smokeless tobacco history on file. She reports  that  drinks alcohol. She reports that she does not use illicit drugs.  Family History: Family History  Problem Relation Age of Onset  . Hypertension Mother   . Diabetes Mother      Physical Exam: Filed Vitals:   12/13/12 2102 12/13/12 2205 12/13/12 2324 12/14/12 0243  BP:    135/65  Pulse:   95 88  Temp:    97.7 F (36.5 C)  TempSrc:    Oral  Resp:   24 20  Height:      Weight:      SpO2: 95% 99% 88% 97%   Blood pressure 135/65, pulse 88, temperature 97.7 F (36.5 C), temperature source Oral, resp. rate 20, height 5\' 5"  (1.651 m), weight 81.647 kg (180 lb), SpO2 97.00%. Body mass index is 29.95 kg/(m^2).   GEN:  Pleasant obese and anxious Caucasian lady lying bed in no acute distress; cooperative with exam PSYCH:  alert and oriented x4;  ; affect is appropriate. HEENT: Mucous membranes pink and anicteric; PERRLA; EOM intact; no cervical lymphadenopathy nor thyromegaly or carotid bruit; no JVD; Breasts:: Not examined CHEST WALL: No tenderness CHEST: Normal respiration, occasional rhonchi  HEART: Regular rate and rhythm; no murmurs rubs or gallops ABDOMEN: Obese, soft non-tender; no masses, no organomegaly, normal abdominal bowel sounds; no pannus; no intertriginous candida. Rectal Exam: Not done EXTREMITIES: No bone or joint deformity; age-appropriate arthropathy of the hands and knees; no edema; no ulcerations. Genitalia: not examined PULSES: 2+ and symmetric SKIN: Normal hydration no rash or ulceration CNS: Cranial nerves 2-12 grossly intact no focal lateralizing neurologic deficit   Labs on Admission:  Basic Metabolic Panel:  Recent Labs Lab 12/13/12 2116  NA 137  K 4.1  CL 97  CO2 32  GLUCOSE 305*  BUN 17  CREATININE 0.70  CALCIUM 9.4   Liver Function Tests: No results found for this basename: AST, ALT, ALKPHOS, BILITOT, PROT, ALBUMIN,  in the last 168 hours No results found for this basename: LIPASE, AMYLASE,  in the last 168 hours No results found for  this basename: AMMONIA,  in the last 168 hours CBC:  Recent Labs Lab 12/13/12 2116  WBC 10.4  NEUTROABS 5.9  HGB 15.1*  HCT 45.4  MCV 93.0  PLT 261   Cardiac Enzymes: No results found for this basename: CKTOTAL, CKMB, CKMBINDEX, TROPONINI,  in the last 168 hours BNP: No components found with this basename: POCBNP,  D-dimer: No components found with this basename: D-DIMER,  CBG:  Recent Labs Lab 12/13/12 2054 12/14/12 0240  GLUCAP 324* 381*    Radiological Exams on Admission: Dg Chest 2 View  12/13/2012   CLINICAL DATA:  Cough and fever  EXAM: CHEST  2 VIEW  COMPARISON:  11/19/2012  FINDINGS: The heart size and mediastinal contours are within normal limits. Both lungs  are clear. The visualized skeletal structures are unremarkable.  IMPRESSION: No active cardiopulmonary disease.   Electronically Signed   By: Alcide Clever M.D.   On: 12/13/2012 21:33      Assessment/Plan   Active Problems:   COPD exacerbation  Serial nebs; will not give antibiotics at this time she is at a couple courses already; continue oral steroids     Diabetes type 2, uncontrolled  Continue home antidiabetic medication; add a moderate sliding scale since she'll be on steroid   Chronic pain disorder  Continue home pain medication   Diabetic neuropathy  Continue home pain medication   Tobacco abuse  Nicotine patch   Recommend outpatient pulmonary assessment for asthma versus COPD in view of family history of wheezing   Other plans as per orders.  Code Status: Full  Disposition Plan: Home in the morning    Silver Achey Nocturnist [facility] Pager [phone]   12/14/2012, 3:12 AM

## 2012-12-15 LAB — GLUCOSE, CAPILLARY
Glucose-Capillary: 263 mg/dL — ABNORMAL HIGH (ref 70–99)
Glucose-Capillary: 239 mg/dL — ABNORMAL HIGH (ref 70–99)

## 2012-12-15 MED ORDER — LEVOFLOXACIN 500 MG PO TABS: 500.0000 mg | ORAL_TABLET | Freq: Every day | ORAL | Status: DC

## 2012-12-15 MED ORDER — PREDNISONE 20 MG PO TABS: ORAL_TABLET | ORAL | Status: DC

## 2012-12-15 MED ORDER — NICOTINE 21 MG/24HR TD PT24: 1.0000 | MEDICATED_PATCH | Freq: Every day | TRANSDERMAL | Status: DC

## 2012-12-15 MED ORDER — GUAIFENESIN ER 600 MG PO TB12: 1200.0000 mg | ORAL_TABLET | Freq: Two times a day (BID) | ORAL | Status: DC
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
## 2012-12-15 NOTE — Progress Notes (Signed)
During the night patient was caught smoking a cigarette by the respiratory therapist. Staff educated patient on the importance of not smoking. Pt gave cigarette to staff and stated that she did not have any more cigarettes. Was given breathing tx by respiratory therapist. Will continue to monitor.

## 2012-12-15 NOTE — Discharge Summary (Signed)
Physician Discharge Summary  [name] BMW:[id number] DOB: [birth year] DOA: 12/13/2012  PCP: Rikki Spearing, FNP  Admit date: 12/13/2012 Discharge date: 12/15/2012  Time spent: Greater than 30 minutes  Recommendations for Outpatient Follow-up:  1. Followup with primary care physician in the next couple weeks.   Discharge Diagnoses:  1. COPD exacerbation, improving. 2. Uncontrolled type 2 diabetes mellitus, hemoglobin A1c 10.3%. 3. Chronic pain disorder. 4. Tobacco abuse. 5. Obesity.   Discharge Condition: Stable and improved.  Diet recommendation: Carbohydrate modified diet.  Filed Weights   12/13/12 2026 12/15/12 0500  Weight: 81.647 kg (180 lb) 85 kg (187 lb 6.3 oz)    History of present illness:  This 52 year old lady presented to the hospital with symptoms of wheezing. Please see initial history as outlined below: HPI:  Autumn Faulkner is a 52 y.o. female. Heavy tobacco use, Presents with her third episode of bronchitis in 4 week  She did not settle down sufficiently after nebulizations in the emergency room and hospitalist service was called  Hospital Course:  Patient was admitted and appropriately started on oral steroids. Oral antibiotics were then added to her medications. Her blood glucose was elevated on steroids. She was educated about diet and the importance of tobacco cessation. She has improved sufficiently now that she can be discharged home. She feels much better, there is no wheezing. Chest x-ray did not show any pneumonia.  Procedures:  None.  Consultations:  None.  Discharge Exam: Filed Vitals:   12/15/12 0426  BP: 117/66  Pulse: 77  Temp: 97.3 F (36.3 C)  Resp: 20    General: She looks systemically well. There is no increased work of breathing. There is no peripheral or central cyanosis. Cardiovascular: Heart sounds are present without murmurs or added sounds. Respiratory: Lung fields are absolutely clear with no wheezing whatsoever today.  There are no crackles. There is no bronchial breathing. She is alert and orientated.  Discharge Instructions  Discharge Orders   Future Orders Complete By Expires   Diet - low sodium heart healthy  As directed    Increase activity slowly  As directed        Medication List         ALPRAZolam 1 MG tablet  Commonly known as:  XANAX  Take 0.5-1 mg by mouth 2 (two) times daily as needed for sleep or anxiety (Always takes 1 mg in the evening. May take 0.5 mg during the day .).     aspirin EC 81 MG tablet  Take 81 mg by mouth at bedtime.     atenolol 50 MG tablet  Commonly known as:  TENORMIN  Take 50 mg by mouth daily.     atorvastatin 40 MG tablet  Commonly known as:  LIPITOR  Take 40 mg by mouth daily.     clopidogrel 75 MG tablet  Commonly known as:  PLAVIX  Take 75 mg by mouth daily.     gabapentin 800 MG tablet  Commonly known as:  NEURONTIN  Take 800-1,600 mg by mouth 3 (three) times daily. Takes 800 mg twice a day and 1600 in the evening.     glipiZIDE 5 MG tablet  Commonly known as:  GLUCOTROL  Take 5 mg by mouth 2 (two) times daily before a meal.     guaiFENesin 600 MG 12 hr tablet  Commonly known as:  MUCINEX  Take 2 tablets (1,200 mg total) by mouth 2 (two) times daily.     insulin glargine 100 UNIT/ML  injection  Commonly known as:  LANTUS  Inject 60 Units into the skin at bedtime.     levofloxacin 500 MG tablet  Commonly known as:  LEVAQUIN  Take 1 tablet (500 mg total) by mouth daily.     metFORMIN 500 MG tablet  Commonly known as:  GLUCOPHAGE  Take 500 mg by mouth 3 (three) times daily.     nicotine 21 mg/24hr patch  Commonly known as:  NICODERM CQ - dosed in mg/24 hours  Place 1 patch onto the skin daily.     ondansetron 8 MG tablet  Commonly known as:  ZOFRAN  Take 8 mg by mouth every 8 (eight) hours as needed for nausea.     oxyCODONE-acetaminophen 10-325 MG per tablet  Commonly known as:  PERCOCET  Take 1 tablet by mouth every 4 (four)  hours as needed for pain.     predniSONE 20 MG tablet  Commonly known as:  DELTASONE  Take 2 tablets daily for 3 days, then 1 tablet daily for 3 days, then half tablet daily for 3 days, then STOP.     Vitamin B-12 1000 MCG Subl  Place 1,000 mcg under the tongue daily.       Allergies  Allergen Reactions  . Penicillins Other (See Comments)    Unknown        Follow-up Information   Follow up with Rikki Spearing, FNP. Call in 1 week.   Specialty:  Family Medicine   Contact information:   [address] [place] [address] [facility] AT [facility] [place] [address] [phone]        The results of significant diagnostics from this hospitalization (including imaging, microbiology, ancillary and laboratory) are listed below for reference.    Significant Diagnostic Studies: Dg Chest 2 View  12/13/2012   CLINICAL DATA:  Cough and fever  EXAM: CHEST  2 VIEW  COMPARISON:  11/19/2012  FINDINGS: The heart size and mediastinal contours are within normal limits. Both lungs are clear. The visualized skeletal structures are unremarkable.  IMPRESSION: No active cardiopulmonary disease.   Electronically Signed   By: Alcide Clever M.D.   On: 12/13/2012 21:33   Dg Chest 2 View  11/19/2012   *RADIOLOGY REPORT*  Clinical Data: Cough, congestion, shortness of breath  CHEST - 2 VIEW  Comparison: 08/09/2008  Findings: Lungs are clear. No pleural effusion or pneumothorax.  The heart is normal in size.  Mild degenerative changes of the visualized thoracolumbar spine.  IMPRESSION: No evidence of acute cardiopulmonary disease.   Original Report Authenticated By: Charline Bills, M.D.    Microbiology:    Labs: Basic Metabolic Panel:  Recent Labs Lab 12/13/12 2116 12/14/12 0426  NA 137 137  K 4.1 4.4  CL 97 97  CO2 32 27  GLUCOSE 305* 418*  BUN 17 17  CREATININE 0.70 0.56  CALCIUM 9.4 9.4       CBC:  Recent Labs Lab 12/13/12 2116 12/14/12 0426  WBC 10.4 10.2  NEUTROABS 5.9  --   HGB 15.1*  14.7  HCT 45.4 44.5  MCV 93.0 93.1  PLT 261 240    CBG:  Recent Labs Lab 12/14/12 0741 12/14/12 1201 12/14/12 1702 12/14/12 2207 12/15/12 0751  GLUCAP 389* 269* 282* 263* 239*       Signed:  GOSRANI,NIMISH C  [facility] 12/15/2012, 8:51 AM

## 2012-12-15 NOTE — Progress Notes (Signed)
Discharge instructions and prescriptions given, verbalized understanding, out in stable condition via w/c with staff. 

## 2012-12-16 NOTE — Progress Notes (Signed)
UR chart review completed.  

## 2013-02-11 DIAGNOSIS — R358 Other polyuria: Secondary | ICD-10-CM | POA: Insufficient documentation

## 2013-07-21 DIAGNOSIS — T7421XA Adult sexual abuse, confirmed, initial encounter: Secondary | ICD-10-CM | POA: Insufficient documentation

## 2014-05-26 DIAGNOSIS — J441 Chronic obstructive pulmonary disease with (acute) exacerbation: Secondary | ICD-10-CM | POA: Insufficient documentation

## 2014-06-02 DIAGNOSIS — N393 Stress incontinence (female) (male): Secondary | ICD-10-CM | POA: Insufficient documentation

## 2014-06-02 DIAGNOSIS — N3001 Acute cystitis with hematuria: Secondary | ICD-10-CM | POA: Insufficient documentation

## 2014-09-01 ENCOUNTER — Encounter: Payer: Self-pay | Admitting: Emergency Medicine

## 2014-09-01 ENCOUNTER — Emergency Department: Payer: Medicaid Other

## 2014-09-01 ENCOUNTER — Emergency Department
Admission: EM | Admit: 2014-09-01 | Discharge: 2014-09-01 | Disposition: A | Discharge status: Home / Self Care | Payer: Medicaid Other | Attending: Emergency Medicine | Admitting: Emergency Medicine

## 2014-09-01 DIAGNOSIS — S59901A Unspecified injury of right elbow, initial encounter: Secondary | ICD-10-CM | POA: Diagnosis present

## 2014-09-01 DIAGNOSIS — E1140 Type 2 diabetes mellitus with diabetic neuropathy, unspecified: Secondary | ICD-10-CM | POA: Diagnosis not present

## 2014-09-01 DIAGNOSIS — Y9289 Other specified places as the place of occurrence of the external cause: Secondary | ICD-10-CM | POA: Insufficient documentation

## 2014-09-01 DIAGNOSIS — Z7982 Long term (current) use of aspirin: Secondary | ICD-10-CM | POA: Diagnosis not present

## 2014-09-01 DIAGNOSIS — Z794 Long term (current) use of insulin: Secondary | ICD-10-CM | POA: Diagnosis not present

## 2014-09-01 DIAGNOSIS — Z7902 Long term (current) use of antithrombotics/antiplatelets: Secondary | ICD-10-CM | POA: Insufficient documentation

## 2014-09-01 DIAGNOSIS — Y9389 Activity, other specified: Secondary | ICD-10-CM | POA: Insufficient documentation

## 2014-09-01 DIAGNOSIS — S5001XA Contusion of right elbow, initial encounter: Secondary | ICD-10-CM | POA: Diagnosis not present

## 2014-09-01 DIAGNOSIS — W228XXA Striking against or struck by other objects, initial encounter: Secondary | ICD-10-CM | POA: Insufficient documentation

## 2014-09-01 DIAGNOSIS — Z720 Tobacco use: Secondary | ICD-10-CM | POA: Diagnosis not present

## 2014-09-01 DIAGNOSIS — Z79899 Other long term (current) drug therapy: Secondary | ICD-10-CM | POA: Insufficient documentation

## 2014-09-01 DIAGNOSIS — Y998 Other external cause status: Secondary | ICD-10-CM | POA: Insufficient documentation

## 2014-09-01 MED ORDER — KETOROLAC TROMETHAMINE 10 MG PO TABS: 10.0000 mg | ORAL_TABLET | Freq: Three times a day (TID) | ORAL | Status: DC
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
## 2014-09-01 NOTE — Discharge Instructions (Signed)
Assault, General Assault includes any behavior, whether intentional or reckless, which results in bodily injury to another person and/or damage to property. Included in this would be any behavior, intentional or reckless, that by its nature would be understood (interpreted) by a reasonable person as intent to harm another person or to damage his/her property. Threats may be oral or written. They may be communicated through regular mail, computer, fax, or phone. These threats may be direct or implied. FORMS OF ASSAULT INCLUDE:  Physically assaulting a person. This includes physical threats to inflict physical harm as well as:  Slapping.  Hitting.  Poking.  Kicking.  Punching.  Pushing.  Arson.  Sabotage.  Equipment vandalism.  Damaging or destroying property.  Throwing or hitting objects.  Displaying a weapon or an object that appears to be a weapon in a threatening manner.  Carrying a firearm of any kind.  Using a weapon to harm someone.  Using greater physical size/strength to intimidate another.  Making intimidating or threatening gestures.  Bullying.  Hazing.  Intimidating, threatening, hostile, or abusive language directed toward another person.  It communicates the intention to engage in violence against that person. And it leads a reasonable person to expect that violent behavior may occur.  Stalking another person. IF IT HAPPENS AGAIN:  Immediately call for emergency help (911 in [place]  If someone poses clear and immediate danger to you, seek legal authorities to have a protective or restraining order put in place.  Less threatening assaults can at least be reported to authorities. STEPS TO TAKE IF A SEXUAL ASSAULT HAS HAPPENED  Go to an area of safety. This may include a shelter or staying with a friend. Stay away from the area where you have been attacked. A large percentage of sexual assaults are caused by a friend, relative or associate.  If  medications were given by your caregiver, take them as directed for the full length of time prescribed.  Only take over-the-counter or prescription medicines for pain, discomfort, or fever as directed by your caregiver.  If you have come in contact with a sexual disease, find out if you are to be tested again. If your caregiver is concerned about the HIV/AIDS virus, he/she may require you to have continued testing for several months.  For the protection of your privacy, test results can not be given over the phone. Make sure you receive the results of your test. If your test results are not back during your visit, make an appointment with your caregiver to find out the results. Do not assume everything is normal if you have not heard from your caregiver or the medical facility. It is important for you to follow up on all of your test results.  File appropriate papers with authorities. This is important in all assaults, even if it has occurred in a family or by a friend. SEEK MEDICAL CARE IF:  You have new problems because of your injuries.  You have problems that may be because of the medicine you are taking, such as:  Rash.  Itching.  Swelling.  Trouble breathing.  You develop belly (abdominal) pain, feel sick to your stomach (nausea) or are vomiting.  You begin to run a temperature.  You need supportive care or referral to a rape crisis center. These are centers with trained personnel who can help you get through this ordeal. SEEK IMMEDIATE MEDICAL CARE IF:  You are afraid of being threatened, beaten, or abused. In [place] call 911.  You  receive new injuries related to abuse.  You develop severe pain in any area injured in the assault or have any change in your condition that concerns you.  You faint or lose consciousness.  You develop chest pain or shortness of breath. Document Released: 02/27/2005 Document Revised: 05/22/2011 Document Reviewed: 10/16/2007 [facility] Patient  Information 2015 [place], [place]. This information is not intended to replace advice given to you by your health care provider. Make sure you discuss any questions you have with your health care provider.   Elbow Contusion An elbow contusion is a deep bruise of the elbow. Contusions are the result of an injury that caused bleeding under the skin. The contusion may turn blue, purple, or yellow. Minor injuries will give you a painless contusion, but more severe contusions may stay painful and swollen for a few weeks.  CAUSES  An elbow contusion comes from a direct force to that area, such as falling on the elbow. SYMPTOMS   Swelling and redness of the elbow.  Bruising of the elbow area.  Tenderness or soreness of the elbow. DIAGNOSIS  You will have a physical exam and will be asked about your history. You may need an X-ray of your elbow to look for a broken bone (fracture).  TREATMENT  A sling or splint may be needed to support your injury. Resting, elevating, and applying cold compresses to the elbow area are often the best treatments for an elbow contusion. Over-the-counter medicines may also be recommended for pain control. HOME CARE INSTRUCTIONS   Put ice on the injured area.  Put ice in a plastic bag.  Place a towel between your skin and the bag.  Leave the ice on for 15-20 minutes, 03-04 times a day.  Only take over-the-counter or prescription medicines for pain, discomfort, or fever as directed by your caregiver.  Rest your injured elbow until the pain and swelling are better.  Elevate your elbow to reduce swelling.  Apply a compression wrap as directed by your caregiver. This can help reduce swelling and motion. You may remove the wrap for sleeping, showers, and baths. If your fingers become numb, cold, or blue, take the wrap off and reapply it more loosely.  Use your elbow only as directed by your caregiver. You may be asked to do range of motion exercises. Do them as  directed.  See your caregiver as directed. It is very important to keep all follow-up appointments in order to avoid any long-term problems with your elbow, including chronic pain or inability to move your elbow normally. SEEK IMMEDIATE MEDICAL CARE IF:   You have increased redness, swelling, or pain in your elbow.  Your swelling or pain is not relieved with medicines.  You have swelling of the hand and fingers.  You are unable to move your fingers or wrist.  You begin to lose feeling in your hand or fingers.  Your fingers or hand become cold or blue. MAKE SURE YOU:   Understand these instructions.  Will watch your condition.  Will get help right away if you are not doing well or get worse. Document Released: 02/05/2006 Document Revised: 05/22/2011 Document Reviewed: 01/13/2011 [facility] Patient Information 2015 [place], [place]. This information is not intended to replace advice given to you by your health care provider. Make sure you discuss any questions you have with your health care provider.  Your exam and x-ray reveal a contusion and mild swelling to the right elbow without evidence of fracture. Wear the ace bandage as  needed for support.  Apply ice through the splint to reduce pain and swelling.  Follow-up with Ms. Zadie Rhine, MSN for further care and management. Take the Ketorolac as directed, with your regular pain medicine.
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
## 2014-12-09 DIAGNOSIS — R300 Dysuria: Secondary | ICD-10-CM | POA: Insufficient documentation

## 2014-12-18 ENCOUNTER — Emergency Department: Payer: Medicaid Other

## 2014-12-18 ENCOUNTER — Emergency Department
Admission: EM | Admit: 2014-12-18 | Discharge: 2014-12-18 | Disposition: A | Discharge status: Home / Self Care | Payer: Medicaid Other | Attending: Emergency Medicine | Admitting: Emergency Medicine

## 2014-12-18 ENCOUNTER — Encounter: Payer: Self-pay | Admitting: *Deleted

## 2014-12-18 DIAGNOSIS — Z720 Tobacco use: Secondary | ICD-10-CM | POA: Diagnosis not present

## 2014-12-18 DIAGNOSIS — R509 Fever, unspecified: Secondary | ICD-10-CM | POA: Insufficient documentation

## 2014-12-18 DIAGNOSIS — Z792 Long term (current) use of antibiotics: Secondary | ICD-10-CM | POA: Diagnosis not present

## 2014-12-18 DIAGNOSIS — Z794 Long term (current) use of insulin: Secondary | ICD-10-CM | POA: Diagnosis not present

## 2014-12-18 DIAGNOSIS — R109 Unspecified abdominal pain: Secondary | ICD-10-CM

## 2014-12-18 DIAGNOSIS — Z7982 Long term (current) use of aspirin: Secondary | ICD-10-CM | POA: Diagnosis not present

## 2014-12-18 DIAGNOSIS — Z7902 Long term (current) use of antithrombotics/antiplatelets: Secondary | ICD-10-CM | POA: Insufficient documentation

## 2014-12-18 DIAGNOSIS — Z791 Long term (current) use of non-steroidal anti-inflammatories (NSAID): Secondary | ICD-10-CM | POA: Insufficient documentation

## 2014-12-18 DIAGNOSIS — R300 Dysuria: Secondary | ICD-10-CM | POA: Diagnosis not present

## 2014-12-18 DIAGNOSIS — R1031 Right lower quadrant pain: Secondary | ICD-10-CM | POA: Diagnosis present

## 2014-12-18 DIAGNOSIS — Z79899 Other long term (current) drug therapy: Secondary | ICD-10-CM | POA: Insufficient documentation

## 2014-12-18 DIAGNOSIS — E1140 Type 2 diabetes mellitus with diabetic neuropathy, unspecified: Secondary | ICD-10-CM | POA: Diagnosis not present

## 2014-12-18 DIAGNOSIS — R110 Nausea: Secondary | ICD-10-CM | POA: Insufficient documentation

## 2014-12-18 DIAGNOSIS — M549 Dorsalgia, unspecified: Secondary | ICD-10-CM | POA: Insufficient documentation

## 2014-12-18 LAB — COMPREHENSIVE METABOLIC PANEL
ALK PHOS: 96 U/L (ref 38–126)
ALT: 24 U/L (ref 14–54)
ANION GAP: 9 (ref 5–15)
AST: 22 U/L (ref 15–41)
Albumin: 3.9 g/dL (ref 3.5–5.0)
BILIRUBIN TOTAL: 0.4 mg/dL (ref 0.3–1.2)
BUN: 16 mg/dL (ref 6–20)
CALCIUM: 9.4 mg/dL (ref 8.9–10.3)
CO2: 29 mmol/L (ref 22–32)
Chloride: 97 mmol/L — ABNORMAL LOW (ref 101–111)
Creatinine, Ser: 0.76 mg/dL (ref 0.44–1.00)
GFR calc Af Amer: >60 mL/min (ref >60)
GLUCOSE: 409 mg/dL — AB (ref 65–99)
POTASSIUM: 3.9 mmol/L (ref 3.5–5.1)
Sodium: 135 mmol/L (ref 135–145)
TOTAL PROTEIN: 7.1 g/dL (ref 6.5–8.1)

## 2014-12-18 LAB — URINALYSIS COMPLETE WITH MICROSCOPIC (ARMC ONLY)
BACTERIA UA: NONE SEEN
Bilirubin Urine: NEGATIVE
HGB URINE DIPSTICK: NEGATIVE
Ketones, ur: NEGATIVE mg/dL
LEUKOCYTES UA: NEGATIVE
Nitrite: NEGATIVE
Protein, ur: NEGATIVE mg/dL
Specific Gravity, Urine: 1.032 — ABNORMAL HIGH (ref 1.005–1.030)
pH: 5 (ref 5.0–8.0)

## 2014-12-18 LAB — CBC
HEMATOCRIT: 45.5 % (ref 35.0–47.0)
Hemoglobin: 15.3 g/dL (ref 12.0–16.0)
MCH: 30.8 pg (ref 26.0–34.0)
MCHC: 33.7 g/dL (ref 32.0–36.0)
MCV: 91.4 fL (ref 80.0–100.0)
Platelets: 200 10*3/uL (ref 150–440)
RBC: 4.98 MIL/uL (ref 3.80–5.20)
RDW: 13.7 % (ref 11.5–14.5)
WBC: 12 10*3/uL — ABNORMAL HIGH (ref 3.6–11.0)

## 2014-12-18 LAB — GLUCOSE, CAPILLARY: Glucose-Capillary: 261 mg/dL — ABNORMAL HIGH (ref 65–99)

## 2014-12-18 LAB — LIPASE, BLOOD: LIPASE: 14 U/L — AB (ref 22–51)

## 2014-12-18 MED ORDER — SODIUM CHLORIDE 0.9 % IV BOLUS (SEPSIS)
1000.0000 mL | Freq: Once | INTRAVENOUS | Status: AC
  Administered 2014-12-18: 1000 mL via INTRAVENOUS

## 2014-12-18 MED ORDER — HYDROCODONE-ACETAMINOPHEN 5-325 MG PO TABS: 1.0000 | ORAL_TABLET | ORAL | Status: DC | PRN

## 2014-12-18 MED ORDER — INSULIN ASPART 100 UNIT/ML ~~LOC~~ SOLN
8.0000 [IU] | Freq: Once | SUBCUTANEOUS | Status: AC
  Administered 2014-12-18: 8 [IU] via INTRAVENOUS
  Filled 2014-12-18: qty 8

## 2014-12-18 MED ORDER — MORPHINE SULFATE (PF) 4 MG/ML IV SOLN
4.0000 mg | Freq: Once | INTRAVENOUS | Status: AC
  Administered 2014-12-18: 4 mg via INTRAVENOUS
  Filled 2014-12-18: qty 1

## 2014-12-18 NOTE — ED Notes (Signed)
Patient transported to CT 

## 2014-12-18 NOTE — ED Provider Notes (Signed)
[facility] Emergency Department Provider Note  Time seen: 4:01 PM  I have reviewed the triage vital signs and the nursing notes.   HISTORY  Chief Complaint Cough and Flank Pain    HPI Autumn Faulkner is a 54 y.o. female with a past medical history of diabetes, hyperlipidemia, chronic pain, recent angioplasty of her lower extremities, presents the emergency department right flank pain. According to the patient she's had 4-5 weeks of right back/right flank discomfort. She states the pain comes and goes. Occasionally she will notice very dark urine which often has a foul smell. States intermittent subjective fevers. Some nausea but denies vomiting. Denies diarrhea. Denies black or bloody stool. Patient had an angioplasty of her lower extremities performed yesterday at [facility] for what sounds like claudication issues. Patient denies any pain in her legs. States the back/flank pain has been ongoing for many weeks before this procedure was performed, she only came today because she was tired of dealing with it per patient.  Describes her flank pain as a dull/aching discomfort moderate in intensity, no modifying factors identified.     Past Medical History  Diagnosis Date  . Diabetes mellitus   . Neuropathy ([place])   . High cholesterol   . Tachycardia   . Neuropathy ([place])   . Chronic back pain   . Chronic shoulder pain   . High cholesterol     Patient Active Problem List   Diagnosis Date Noted  . COPD exacerbation ([place]) 12/14/2012  . Hyperglycemia without ketosis 12/14/2012  . Diabetes type 2, uncontrolled ([place]) 12/14/2012  . Chronic pain disorder 12/14/2012  . Diabetic neuropathy ([place]) 12/14/2012  . Tobacco abuse 12/14/2012    Past Surgical History  Procedure Laterality Date  . Back surgery    . Abdominal hysterectomy    . Shoulder surgery    . Iliac artery stent      Current Outpatient Rx  Name  Route  Sig  Dispense  Refill  . ALPRAZolam (XANAX) 1 MG  tablet   Oral   Take 0.5-1 mg by mouth 2 (two) times daily as needed for sleep or anxiety (Always takes 1 mg in the evening. May take 0.5 mg during the day .).          Marland Kitchen aspirin EC 81 MG tablet   Oral   Take 81 mg by mouth at bedtime.         Marland Kitchen atenolol (TENORMIN) 50 MG tablet   Oral   Take 50 mg by mouth daily.         Marland Kitchen atorvastatin (LIPITOR) 40 MG tablet   Oral   Take 40 mg by mouth daily.         . clopidogrel (PLAVIX) 75 MG tablet   Oral   Take 75 mg by mouth daily.         . Cyanocobalamin (VITAMIN B-12) 1000 MCG SUBL   Sublingual   Place 1,000 mcg under the tongue daily.         Marland Kitchen gabapentin (NEURONTIN) 800 MG tablet   Oral   Take 800-1,600 mg by mouth 3 (three) times daily. Takes 800 mg twice a day and 1600 in the evening.         Marland Kitchen glipiZIDE (GLUCOTROL) 5 MG tablet   Oral   Take 5 mg by mouth 2 (two) times daily before a meal.           . guaiFENesin (MUCINEX) 600 MG 12 hr tablet  Oral   Take 2 tablets (1,200 mg total) by mouth 2 (two) times daily.   20 tablet   0   . insulin glargine (LANTUS) 100 UNIT/ML injection   Subcutaneous   Inject 60 Units into the skin at bedtime.         Marland Kitchen ketorolac (TORADOL) 10 MG tablet   Oral   Take 1 tablet (10 mg total) by mouth every 8 (eight) hours.   15 tablet   0   . levofloxacin (LEVAQUIN) 500 MG tablet   Oral   Take 1 tablet (500 mg total) by mouth daily.   7 tablet   0   . metFORMIN (GLUCOPHAGE) 500 MG tablet   Oral   Take 500 mg by mouth 3 (three) times daily.          . nicotine (NICODERM CQ - DOSED IN MG/24 HOURS) 21 mg/24hr patch   Transdermal   Place 1 patch onto the skin daily.   28 patch   0   . ondansetron (ZOFRAN) 8 MG tablet   Oral   Take 8 mg by mouth every 8 (eight) hours as needed for nausea.         Marland Kitchen oxyCODONE-acetaminophen (PERCOCET) 10-325 MG per tablet   Oral   Take 1 tablet by mouth every 4 (four) hours as needed for pain.         . predniSONE  (DELTASONE) 20 MG tablet      Take 2 tablets daily for 3 days, then 1 tablet daily for 3 days, then half tablet daily for 3 days, then STOP.   12 tablet   0     Allergies Review of patient's allergies indicates no known allergies.  Family History  Problem Relation Age of Onset  . Hypertension Mother   . Diabetes Mother     Social History Social History  Substance Use Topics  . Smoking status: Current Every Day Smoker -- 1.00 packs/day    Types: Cigarettes  . Smokeless tobacco: None  . Alcohol Use: Yes    Review of Systems Constitutional: Intermittent subjective fevers. Cardiovascular: Negative for chest pain. Respiratory: Negative for shortness of breath. Gastrointestinal: Mild lower abdominal pain. Positive nausea. Negative for vomiting or diarrhea. Genitourinary: Occasional dysuria, with dark urine.  Musculoskeletal: Intermittent right back pain. Neurological: Negative for headache 10-point ROS otherwise negative.  ____________________________________________   PHYSICAL EXAM:  VITAL SIGNS: ED Triage Vitals  Enc Vitals Group     BP 12/18/14 1430 104/50 mmHg     Pulse Rate 12/18/14 1430 83     Resp 12/18/14 1430 16     Temp 12/18/14 1430 98.5 F (36.9 C)     Temp Source 12/18/14 1430 Oral     SpO2 12/18/14 1430 95 %     Weight 12/18/14 1430 184 lb (83.462 kg)     Height 12/18/14 1430 5\' 5"  (1.651 m)     Head Cir --      Peak Flow --      Pain Score 12/18/14 1428 8     Pain Loc --      Pain Edu? --      Excl. in [place]? --     Constitutional: Alert and oriented. Well appearing and in no distress. Eyes: Normal exam ENT   Head: Normocephalic and atraumatic   Mouth/Throat: Mucous membranes are moist. Cardiovascular: Normal rate, regular rhythm. No murmur Respiratory: Normal respiratory effort without tachypnea nor retractions. Breath sounds are clear  Gastrointestinal: Soft and  nontender. No distention.  Mild right CVA tenderness. Musculoskeletal:  Nontender with normal range of motion in all extremities.  Neurologic:  Normal speech and language. No gross focal neurologic deficits  Psychiatric: Mood and affect are normal. Speech and behavior are normal.  ____________________________________________   INITIAL IMPRESSION / ASSESSMENT AND PLAN / ED COURSE  Pertinent labs & imaging results that were available during my care of the patient were reviewed by me and considered in my medical decision making (see chart for details).  Patient with right flank pain for 4-5 weeks, moderate in intensity, intermittent. Has noted occasional dark urine, with a foul smell. We'll check labs including urinalysis, blood work, CT renal scan to rule out ureterolithiasis, and closely monitor in the emergency department.  Labs are largely within normal limits besides a mild leukocytosis. CT scan shows no acute abnormality to explain her right flank pain. I discussed with the patient follow up with her primary care physician on Monday. Patient agreeable to plan we'll discharge the short course of pain medication.  ____________________________________________   FINAL CLINICAL IMPRESSION(S) / ED DIAGNOSES  Right flank pain   Harvest Dark, MD 12/18/14 2023

## 2014-12-18 NOTE — Discharge Instructions (Signed)
Please follow-up with your primary care physician on Monday for reevaluation. Return to the emergency department for any worsening pain, fever, or any other symptom personally concerning to your self.   Flank Pain Flank pain refers to pain that is located on the side of the body between the upper abdomen and the back. The pain may occur over a short period of time (acute) or may be long-term or reoccurring (chronic). It may be mild or severe. Flank pain can be caused by many things. CAUSES  Some of the more common causes of flank pain include:  Muscle strains.   Muscle spasms.   A disease of your spine (vertebral disk disease).   A lung infection (pneumonia).   Fluid around your lungs (pulmonary edema).   A kidney infection.   Kidney stones.   A very painful skin rash caused by the chickenpox virus (shingles).   Gallbladder disease.  [place] care will depend on the cause of your pain. In general,  Rest as directed by your caregiver.  Drink enough fluids to keep your urine clear or pale yellow.  Only take over-the-counter or prescription medicines as directed by your caregiver. Some medicines may help relieve the pain.  Tell your caregiver about any changes in your pain.  Follow up with your caregiver as directed. SEEK IMMEDIATE MEDICAL CARE IF:   Your pain is not controlled with medicine.   You have new or worsening symptoms.  Your pain increases.   You have abdominal pain.   You have shortness of breath.   You have persistent nausea or vomiting.   You have swelling in your abdomen.   You feel faint or pass out.   You have blood in your urine.  You have a fever or persistent symptoms for more than 2-3 days.  You have a fever and your symptoms suddenly get worse. MAKE SURE YOU:   Understand these instructions.  Will watch your condition.  Will get help right away if you are not doing well or get worse.   This  information is not intended to replace advice given to you by your health care provider. Make sure you discuss any questions you have with your health care provider.   Document Released: 04/20/2005 Document Revised: 11/22/2011 Document Reviewed: 10/12/2011 Elsevier Interactive Patient Education [employer].

## 2014-12-18 NOTE — ED Notes (Signed)
Pt unable to provide urine specimen at this time

## 2014-12-18 NOTE — ED Notes (Signed)
Bilateral pedal pulses found with doppler and marked by Lenny Pastel.

## 2014-12-18 NOTE — ED Notes (Signed)
Pt reports lower right side back pain x 4-5 weeks. Also reports upper respiratory infection with cough since yesterday. 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

## 2015-02-08 ENCOUNTER — Ambulatory Visit: Payer: Medicaid Other | Admitting: Dietician

## 2015-04-07 ENCOUNTER — Emergency Department: Payer: Medicaid Other

## 2015-04-07 ENCOUNTER — Emergency Department
Admission: EM | Admit: 2015-04-07 | Discharge: 2015-04-07 | Disposition: A | Discharge status: Home / Self Care | Payer: Medicaid Other | Attending: Emergency Medicine | Admitting: Emergency Medicine

## 2015-04-07 ENCOUNTER — Encounter: Payer: Self-pay | Admitting: Emergency Medicine

## 2015-04-07 DIAGNOSIS — Z7902 Long term (current) use of antithrombotics/antiplatelets: Secondary | ICD-10-CM | POA: Diagnosis not present

## 2015-04-07 DIAGNOSIS — S8991XA Unspecified injury of right lower leg, initial encounter: Secondary | ICD-10-CM | POA: Insufficient documentation

## 2015-04-07 DIAGNOSIS — S161XXA Strain of muscle, fascia and tendon at neck level, initial encounter: Secondary | ICD-10-CM | POA: Diagnosis not present

## 2015-04-07 DIAGNOSIS — Z7984 Long term (current) use of oral hypoglycemic drugs: Secondary | ICD-10-CM | POA: Insufficient documentation

## 2015-04-07 DIAGNOSIS — F17210 Nicotine dependence, cigarettes, uncomplicated: Secondary | ICD-10-CM | POA: Insufficient documentation

## 2015-04-07 DIAGNOSIS — E1140 Type 2 diabetes mellitus with diabetic neuropathy, unspecified: Secondary | ICD-10-CM | POA: Diagnosis not present

## 2015-04-07 DIAGNOSIS — Z7982 Long term (current) use of aspirin: Secondary | ICD-10-CM | POA: Diagnosis not present

## 2015-04-07 DIAGNOSIS — Z794 Long term (current) use of insulin: Secondary | ICD-10-CM | POA: Insufficient documentation

## 2015-04-07 DIAGNOSIS — R52 Pain, unspecified: Secondary | ICD-10-CM

## 2015-04-07 DIAGNOSIS — S40012A Contusion of left shoulder, initial encounter: Secondary | ICD-10-CM | POA: Insufficient documentation

## 2015-04-07 DIAGNOSIS — S60222A Contusion of left hand, initial encounter: Secondary | ICD-10-CM | POA: Insufficient documentation

## 2015-04-07 DIAGNOSIS — Z79899 Other long term (current) drug therapy: Secondary | ICD-10-CM | POA: Insufficient documentation

## 2015-04-07 DIAGNOSIS — Y998 Other external cause status: Secondary | ICD-10-CM | POA: Diagnosis not present

## 2015-04-07 DIAGNOSIS — Y9389 Activity, other specified: Secondary | ICD-10-CM | POA: Diagnosis not present

## 2015-04-07 DIAGNOSIS — Y92410 Unspecified street and highway as the place of occurrence of the external cause: Secondary | ICD-10-CM | POA: Diagnosis not present

## 2015-04-07 DIAGNOSIS — S4992XA Unspecified injury of left shoulder and upper arm, initial encounter: Secondary | ICD-10-CM | POA: Diagnosis present

## 2015-04-07 MED ORDER — OXYCODONE-ACETAMINOPHEN 5-325 MG PO TABS
2.0000 | ORAL_TABLET | Freq: Once | ORAL | Status: AC
  Administered 2015-04-07: 2 via ORAL
  Filled 2015-04-07: qty 2

## 2015-04-07 MED ORDER — METHOCARBAMOL 500 MG PO TABS: ORAL_TABLET | ORAL | Status: DC

## 2015-04-07 MED ORDER — OXYCODONE-ACETAMINOPHEN 5-325 MG PO TABS: 1.0000 | ORAL_TABLET | ORAL | Status: DC | PRN

## 2015-04-07 NOTE — ED Notes (Signed)
Patient transported to CT 
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
## 2015-04-07 NOTE — Discharge Instructions (Signed)
Follow-up with your doctor if any continued problems. Take medication only as written. Moist heat or ice to muscles as needed for pain and be aware that you will be more stiff tomorrow than  right now.
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
## 2015-09-27 ENCOUNTER — Ambulatory Visit (INDEPENDENT_AMBULATORY_CARE_PROVIDER_SITE_OTHER): Payer: Medicaid Other | Admitting: Urology

## 2015-09-27 ENCOUNTER — Encounter: Payer: Self-pay | Admitting: Urology

## 2015-09-27 VITALS — BP 132/77 | HR 76 | Ht 65.0 in | Wt 196.7 lb

## 2015-09-27 DIAGNOSIS — M810 Age-related osteoporosis without current pathological fracture: Secondary | ICD-10-CM | POA: Insufficient documentation

## 2015-09-27 DIAGNOSIS — N3946 Mixed incontinence: Secondary | ICD-10-CM

## 2015-09-27 DIAGNOSIS — J449 Chronic obstructive pulmonary disease, unspecified: Secondary | ICD-10-CM | POA: Insufficient documentation

## 2015-09-27 DIAGNOSIS — R3129 Other microscopic hematuria: Secondary | ICD-10-CM

## 2015-09-27 DIAGNOSIS — E782 Mixed hyperlipidemia: Secondary | ICD-10-CM | POA: Insufficient documentation

## 2015-09-27 DIAGNOSIS — E119 Type 2 diabetes mellitus without complications: Secondary | ICD-10-CM | POA: Insufficient documentation

## 2015-09-27 LAB — URINALYSIS, COMPLETE
BILIRUBIN UA: NEGATIVE
KETONES UA: NEGATIVE
Leukocytes, UA: NEGATIVE
NITRITE UA: NEGATIVE
Protein, UA: NEGATIVE
RBC UA: NEGATIVE
UUROB: 0.2 mg/dL (ref 0.2–1.0)
pH, UA: 5 (ref 5.0–7.5)

## 2015-09-27 LAB — BLADDER SCAN AMB NON-IMAGING: Scan Result: 25

## 2015-09-27 LAB — MICROSCOPIC EXAMINATION
Bacteria, UA: NONE SEEN
RBC MICROSCOPIC, UA: NONE SEEN /HPF (ref 0–?)
WBC, UA: NONE SEEN /hpf (ref 0–?)

## 2015-09-27 NOTE — Progress Notes (Signed)
09/27/2015 4:27 PM   Autumn Faulkner 08/27/1960 [id number]  Referring provider: Tilden Dome, FNP No address on file  Chief Complaint  Patient presents with  . New Patient (Initial Visit)    frequent urination, recurrent uti    HPI: The patient is had a multiple year history of right flank pain. She's on the emergency room last fall. CT scan without contrast was normal. She has been assessed for other providers for the same but she thinks is her kidney  At baseline she has mild stress and urge incontinence wearing a pad. She has moderate bedwetting. She can void twice in our and does get urinary tract infections. She's never had a kidney stone.  Modifying factors: There are no other modifying factors  Associated signs and symptoms: There are no other associated signs and symptoms Aggravating and relieving factors: There are no other aggravating or relieving factors Severity: Moderate Duration: Persistent   PMH: Past Medical History  Diagnosis Date  . Diabetes mellitus   . Neuropathy ([place])   . High cholesterol   . Tachycardia   . Neuropathy ([place])   . Chronic back pain   . Chronic shoulder pain   . High cholesterol     Surgical History: Past Surgical History  Procedure Laterality Date  . Back surgery    . Abdominal hysterectomy    . Shoulder surgery    . Iliac artery stent    . Gallbladder surgery      Home Medications:        Medication List       This list is accurate as of: 09/27/15  4:27 PM.  Always use your most recent med list.               ALPRAZolam 1 MG tablet  Commonly known as:  XANAX  Take 0.5-1 mg by mouth 2 (two) times daily as needed for sleep or anxiety (Always takes 1 mg in the evening. May take 0.5 mg during the day .).     aspirin EC 81 MG tablet  Take 81 mg by mouth at bedtime.     atenolol 50 MG tablet  Commonly known as:  TENORMIN  Take 50 mg by mouth daily.     atorvastatin 40 MG tablet  Commonly known as:  LIPITOR    Take 40 mg by mouth daily.     clopidogrel 75 MG tablet  Commonly known as:  PLAVIX  Take 75 mg by mouth daily.     gabapentin 800 MG tablet  Commonly known as:  NEURONTIN  Take 800-1,600 mg by mouth 3 (three) times daily. Takes 800 mg twice a day and 1600 in the evening.     glipiZIDE 5 MG tablet  Commonly known as:  GLUCOTROL  Take 5 mg by mouth 2 (two) times daily before a meal.     guaiFENesin 600 MG 12 hr tablet  Commonly known as:  MUCINEX  Take 2 tablets (1,200 mg total) by mouth 2 (two) times daily.     insulin glargine 100 UNIT/ML injection  Commonly known as:  LANTUS  Inject 60 Units into the skin at bedtime.     metFORMIN 500 MG tablet  Commonly known as:  GLUCOPHAGE  Take 500 mg by mouth 3 (three) times daily.     methocarbamol 500 MG tablet  Commonly known as:  ROBAXIN  Take 1-2 tablets qid as needed for muscle spasms     nicotine 21 mg/24hr patch  Commonly  known as:  NICODERM CQ - dosed in mg/24 hours  Place 1 patch onto the skin daily.     ondansetron 8 MG tablet  Commonly known as:  ZOFRAN  Take 8 mg by mouth every 8 (eight) hours as needed for nausea.     oxyCODONE-acetaminophen 5-325 MG tablet  Commonly known as:  PERCOCET  Take 1 tablet by mouth every 4 (four) hours as needed for severe pain.     Vitamin B-12 1000 MCG Subl  Place 1,000 mcg under the tongue daily.        Allergies: No Known Allergies  Family History: Family History  Problem Relation Age of Onset  . Hypertension Mother   . Diabetes Mother     Social History:  reports that she has been smoking Cigarettes.  She has been smoking about 1.00 pack per day. She does not have any smokeless tobacco history on file. She reports that she drinks alcohol. She reports that she does not use illicit drugs.  ROS: UROLOGY Frequent Urination?: Yes Hard to postpone urination?: Yes Burning/pain with urination?: Yes Get up at night to urinate?: Yes Leakage of urine?: Yes Urine stream  starts and stops?: No Trouble starting stream?: No Do you have to strain to urinate?: No Blood in urine?: Yes Urinary tract infection?: Yes Sexually transmitted disease?: No Injury to kidneys or bladder?: No Painful intercourse?: No Weak stream?: No Currently pregnant?: No Vaginal bleeding?: No Last menstrual period?: n  Gastrointestinal Nausea?: Yes Vomiting?: No Indigestion/heartburn?: No Diarrhea?: No Constipation?: Yes  Constitutional Fever: No Night sweats?: Yes Weight loss?: Yes Fatigue?: Yes  Skin Skin rash/lesions?: Yes Itching?: No  Eyes Blurred vision?: Yes Double vision?: No  Ears/Nose/Throat Sore throat?: No Sinus problems?: No  Hematologic/Lymphatic Swollen glands?: No Easy bruising?: Yes  Cardiovascular Leg swelling?: No Chest pain?: No  Respiratory Cough?: Yes Shortness of breath?: Yes  Endocrine Excessive thirst?: Yes  Musculoskeletal Back pain?: Yes Joint pain?: Yes  Neurological Headaches?: Yes Dizziness?: Yes  Psychologic Depression?: Yes Anxiety?: Yes  Physical Exam: BP 132/77 mmHg  Pulse 76  Ht 5\' 5"  (1.651 m)  Wt 196 lb 11.2 oz (89.223 kg)  BMI 32.73 kg/m2  Constitutional:  Alert and oriented, No acute distress. HEENT: [place] AT, moist mucus membranes.  Trachea midline, no masses. Cardiovascular: No clubbing, cyanosis, or edema. Respiratory: Normal respiratory effort, no increased work of breathing. GI: Abdomen is soft, nontender, nondistended, no abdominal masses GU: No CVA tenderness.  Skin: No rashes, bruises or suspicious lesions. Lymph: No cervical or inguinal adenopathy. Neurologic: Grossly intact, no focal deficits, moving all 4 extremities. Psychiatric: Normal mood and affect.  Laboratory Data: Lab Results  Component Value Date   WBC 12.0* 12/18/2014   HGB 15.3 12/18/2014   HCT 45.5 12/18/2014   MCV 91.4 12/18/2014   PLT 200 12/18/2014    Lab Results  Component Value Date   CREATININE 0.76  12/18/2014    No results found for: PSA  No results found for: TESTOSTERONE  Lab Results  Component Value Date   HGBA1C 10.3* 12/14/2012    Urinalysis    Component Value Date/Time   COLORURINE YELLOW* 12/18/2014 1553   APPEARANCEUR CLEAR* 12/18/2014 1553   LABSPEC 1.032* 12/18/2014 1553   PHURINE 5.0 12/18/2014 1553   GLUCOSEU >500* 12/18/2014 1553   HGBUR NEGATIVE 12/18/2014 1553   BILIRUBINUR NEGATIVE 12/18/2014 1553   KETONESUR NEGATIVE 12/18/2014 1553   PROTEINUR NEGATIVE 12/18/2014 1553   UROBILINOGEN 0.2 12/14/2012 1652   NITRITE NEGATIVE  12/18/2014 1553   LEUKOCYTESUR NEGATIVE 12/18/2014 1553    Pertinent Imaging: None recent  Assessment & Plan:  The patient is right flank pain. I think to put this to rest it was best to repeat the CT scan. I would order it with and without contrast. If the X-ray is normal she'll be educated that the kidneys is not the source of her pain. Overactive bladder medications can be otherwise prescribed  There are no diagnoses linked to this encounter.  No Follow-up on file.  Zachery Niswander A, MD

## 2015-10-11 ENCOUNTER — Ambulatory Visit: Payer: Medicaid Other | Admitting: *Deleted

## 2015-10-12 ENCOUNTER — Ambulatory Visit: Admission: RE | Admit: 2015-10-12 | Payer: Medicaid Other | Source: Ambulatory Visit

## 2015-10-14 ENCOUNTER — Ambulatory Visit
Admission: RE | Admit: 2015-10-14 | Discharge: 2015-10-14 | Disposition: A | Discharge status: Home / Self Care | Payer: Medicaid Other | Source: Ambulatory Visit | Attending: Urology | Admitting: Urology

## 2015-10-14 DIAGNOSIS — K760 Fatty (change of) liver, not elsewhere classified: Secondary | ICD-10-CM | POA: Insufficient documentation

## 2015-10-14 DIAGNOSIS — D1779 Benign lipomatous neoplasm of other sites: Secondary | ICD-10-CM | POA: Diagnosis not present

## 2015-10-14 DIAGNOSIS — Z9049 Acquired absence of other specified parts of digestive tract: Secondary | ICD-10-CM | POA: Diagnosis not present

## 2015-10-14 DIAGNOSIS — Z90710 Acquired absence of both cervix and uterus: Secondary | ICD-10-CM | POA: Diagnosis not present

## 2015-10-14 DIAGNOSIS — R3129 Other microscopic hematuria: Secondary | ICD-10-CM

## 2015-10-18 ENCOUNTER — Ambulatory Visit (INDEPENDENT_AMBULATORY_CARE_PROVIDER_SITE_OTHER): Payer: Medicaid Other | Admitting: Urology

## 2015-10-18 DIAGNOSIS — N3946 Mixed incontinence: Secondary | ICD-10-CM

## 2015-10-18 DIAGNOSIS — N393 Stress incontinence (female) (male): Secondary | ICD-10-CM | POA: Diagnosis not present

## 2015-10-18 MED ORDER — SOLIFENACIN SUCCINATE 5 MG PO TABS: 5.0000 mg | ORAL_TABLET | Freq: Every day | ORAL | 11 refills | Status: DC

## 2015-10-18 NOTE — Progress Notes (Signed)
10/18/2015 4:10 PM   Autumn Faulkner Jan 04, 1961  [id number]  Referring provider: Maryland Pink, MD [address] [facility] [place], [place] [address]  Chief Complaint  Patient presents with  . Follow-up    CT diagnostic study     HPI: The patient is had a multiple year history of right flank pain. She's on the emergency room last fall. CT scan without contrast was normal. She has been assessed for other providers for the same but she thinks is her kidney  At baseline she has mild stress and urge incontinence wearing a pad. She has moderate bedwetting. She can void twice in our and does get urinary tract infections. She's never had a kidney stone.  Assessment & Plan:  The patient is right flank pain. I think to put this to rest it was best to repeat the CT scan. I would order it with and without contrast. If the X-ray is normal she'll be educated that the kidneys is not the source of her pain. Overactive bladder medications can be otherwise prescribed  Today The patient's CT scan was normal The patient continues to leak both day and night with urgency. She was willing to try Vesicare 5 mg samples and prescription  Frequency stable      PMH: Past Medical History:  Diagnosis Date  . Chronic back pain   . Chronic shoulder pain   . Diabetes mellitus   . High cholesterol   . High cholesterol   . Neuropathy ([place])   . Neuropathy ([place])   . Tachycardia     Surgical History: Past Surgical History:  Procedure Laterality Date  . ABDOMINAL HYSTERECTOMY    . BACK SURGERY    . GALLBLADDER SURGERY    . ILIAC ARTERY STENT    . SHOULDER SURGERY      Home Medications:    Medication List       Accurate as of 10/18/15  4:10 PM. Always use your most recent med list.          ALPRAZolam 1 MG tablet Commonly known as:  XANAX Take 0.5-1 mg by mouth 2 (two) times daily as needed for sleep or anxiety (Always takes 1 mg in the evening. May take 0.5 mg during the day  .).   aspirin EC 81 MG tablet Take 81 mg by mouth at bedtime.   atenolol 50 MG tablet Commonly known as:  TENORMIN Take 50 mg by mouth daily.   atorvastatin 40 MG tablet Commonly known as:  LIPITOR Take 40 mg by mouth daily.   clopidogrel 75 MG tablet Commonly known as:  PLAVIX Take 75 mg by mouth daily.   gabapentin 800 MG tablet Commonly known as:  NEURONTIN Take 800-1,600 mg by mouth 3 (three) times daily. Takes 800 mg twice a day and 1600 in the evening.   glipiZIDE 5 MG tablet Commonly known as:  GLUCOTROL Take 5 mg by mouth 2 (two) times daily before a meal.   guaiFENesin 600 MG 12 hr tablet Commonly known as:  MUCINEX Take 2 tablets (1,200 mg total) by mouth 2 (two) times daily.   insulin glargine 100 UNIT/ML injection Commonly known as:  LANTUS Inject 60 Units into the skin at bedtime.   metFORMIN 500 MG tablet Commonly known as:  GLUCOPHAGE Take 500 mg by mouth 3 (three) times daily.   methocarbamol 500 MG tablet Commonly known as:  ROBAXIN Take 1-2 tablets qid as needed for muscle spasms   nicotine 21 mg/24hr  patch Commonly known as:  NICODERM CQ - dosed in mg/24 hours Place 1 patch onto the skin daily.   ondansetron 8 MG tablet Commonly known as:  ZOFRAN Take 8 mg by mouth every 8 (eight) hours as needed for nausea.   oxyCODONE-acetaminophen 5-325 MG tablet Commonly known as:  PERCOCET Take 1 tablet by mouth every 4 (four) hours as needed for severe pain.   solifenacin 5 MG tablet Commonly known as:  VESICARE Take 1 tablet (5 mg total) by mouth daily.   Vitamin B-12 1000 MCG Subl Place 1,000 mcg under the tongue daily.       Allergies: No Known Allergies  Family History: Family History  Problem Relation Age of Onset  . Hypertension Mother   . Diabetes Mother     Social History:  reports that she has been smoking Cigarettes.  She has been smoking about 1.00 pack per day. She does not have any smokeless tobacco history on file. She  reports that she drinks alcohol. She reports that she does not use drugs.  ROS: UROLOGY Frequent Urination?: Yes Hard to postpone urination?: Yes Burning/pain with urination?: No Get up at night to urinate?: Yes Leakage of urine?: Yes Urine stream starts and stops?: Yes Trouble starting stream?: Yes Do you have to strain to urinate?: No Blood in urine?: No Urinary tract infection?: Yes Sexually transmitted disease?: No Injury to kidneys or bladder?: No Painful intercourse?: No Weak stream?: No Currently pregnant?: No Vaginal bleeding?: No Last menstrual period?: n  Gastrointestinal Nausea?: Yes Vomiting?: No Indigestion/heartburn?: Yes Diarrhea?: No Constipation?: Yes  Constitutional Fever: Yes Night sweats?: Yes Weight loss?: No Fatigue?: Yes  Skin Skin rash/lesions?: No Itching?: No  Eyes Blurred vision?: Yes Double vision?: No  Ears/Nose/Throat Sore throat?: No Sinus problems?: Yes  Hematologic/Lymphatic Swollen glands?: No Easy bruising?: Yes  Cardiovascular Leg swelling?: Yes Chest pain?: Yes  Respiratory Cough?: Yes Shortness of breath?: Yes  Endocrine Excessive thirst?: Yes  Musculoskeletal Back pain?: Yes Joint pain?: Yes  Neurological Headaches?: Yes Dizziness?: Yes  Psychologic Depression?: Yes Anxiety?: No  Physical Exam: There were no vitals taken for this visit.    Laboratory Data: Lab Results  Component Value Date   WBC 12.0 (H) 12/18/2014   HGB 15.3 12/18/2014   HCT 45.5 12/18/2014   MCV 91.4 12/18/2014   PLT 200 12/18/2014    Lab Results  Component Value Date   CREATININE 0.76 12/18/2014    No results found for: PSA  No results found for: TESTOSTERONE  Lab Results  Component Value Date   HGBA1C 10.3 (H) 12/14/2012    Urinalysis    Component Value Date/Time   COLORURINE YELLOW (A) 12/18/2014 1553   APPEARANCEUR Clear 09/27/2015 1602   LABSPEC 1.032 (H) 12/18/2014 1553   PHURINE 5.0 12/18/2014  1553   GLUCOSEU 2+ (A) 09/27/2015 1602   HGBUR NEGATIVE 12/18/2014 1553   BILIRUBINUR Negative 09/27/2015 [place] 12/18/2014 1553   PROTEINUR Negative 09/27/2015 1602   PROTEINUR NEGATIVE 12/18/2014 1553   UROBILINOGEN 0.2 12/14/2012 1652   NITRITE Negative 09/27/2015 1602   NITRITE NEGATIVE 12/18/2014 1553   LEUKOCYTESUR Negative 09/27/2015 1602    Pertinent Imaging: See above  Assessment & Plan:  The patient has urge incontinence and bedwetting. She'll return on Vesicare. She's not dramatically better we will investigate the problem further with a pelvic examination and I may order urodynamics. She understands that her flank and back pain is not from her kidneys.   There  are no diagnoses linked to this encounter.  Return in about 1 month (around 11/18/2015).  Reece Packer, MD  [facility] [address], [place], [place] [address] [phone]

## 2015-11-18 ENCOUNTER — Telehealth: Payer: Self-pay

## 2015-11-18 NOTE — Telephone Encounter (Signed)
Incontinence supplies ordered through [employer].

## 2015-11-26 ENCOUNTER — Ambulatory Visit: Payer: Medicaid Other | Admitting: Urology

## 2015-11-26 ENCOUNTER — Encounter: Payer: Self-pay | Admitting: Urology

## 2016-04-20 ENCOUNTER — Ambulatory Visit (INDEPENDENT_AMBULATORY_CARE_PROVIDER_SITE_OTHER): Payer: Medicaid Other | Admitting: Vascular Surgery

## 2016-04-20 ENCOUNTER — Encounter (INDEPENDENT_AMBULATORY_CARE_PROVIDER_SITE_OTHER): Payer: Self-pay | Admitting: Vascular Surgery

## 2016-04-20 VITALS — BP 130/71 | HR 81 | Resp 16 | Ht 65.5 in | Wt 175.0 lb

## 2016-04-20 DIAGNOSIS — I6523 Occlusion and stenosis of bilateral carotid arteries: Secondary | ICD-10-CM

## 2016-04-20 DIAGNOSIS — Z794 Long term (current) use of insulin: Secondary | ICD-10-CM | POA: Diagnosis not present

## 2016-04-20 DIAGNOSIS — H8113 Benign paroxysmal vertigo, bilateral: Secondary | ICD-10-CM

## 2016-04-20 DIAGNOSIS — H8110 Benign paroxysmal vertigo, unspecified ear: Secondary | ICD-10-CM | POA: Insufficient documentation

## 2016-04-20 DIAGNOSIS — J449 Chronic obstructive pulmonary disease, unspecified: Secondary | ICD-10-CM

## 2016-04-20 DIAGNOSIS — I10 Essential (primary) hypertension: Secondary | ICD-10-CM | POA: Diagnosis not present

## 2016-04-20 DIAGNOSIS — E118 Type 2 diabetes mellitus with unspecified complications: Secondary | ICD-10-CM | POA: Diagnosis not present

## 2016-04-20 NOTE — Progress Notes (Signed)
MRN : [id number]  Autumn Faulkner is a 56 y.o. (1960/07/08) female who presents with chief complaint of  Chief Complaint  Patient presents with  . New Evaluation    Bilateral leg pain  .  History of Present Illness: The patient is seen for evaluation of painful lower extremities. Patient notes the pain is variable and not always associated with activity.  The pain is somewhat consistent day to day occurring on most days. The patient notes the pain also occurs with standing and routinely seems worse as the day wears on. The pain has been progressive over the past several years. The patient states these symptoms are causing  a profound negative impact on quality of life and daily activities.  She has a history of bilateral iliac stenting using the kissing balloon technique.  The patient denies rest pain or dangling of an extremity off the side of the bed during the night for relief. No open wounds or sores at this time. No history of DVT or phlebitis.  There is a  history of back problems and DJD of the lumbar and sacral spine.    Current Meds  Medication Sig  . ALPRAZolam (XANAX) 1 MG tablet Take 0.5-1 mg by mouth 2 (two) times daily as needed for sleep or anxiety (Always takes 1 mg in the evening. May take 0.5 mg during the day .).   Marland Kitchen aspirin EC 81 MG tablet Take 81 mg by mouth at bedtime.  Marland Kitchen atenolol (TENORMIN) 50 MG tablet Take 50 mg by mouth daily.  Marland Kitchen atorvastatin (LIPITOR) 40 MG tablet Take 40 mg by mouth daily.  . clopidogrel (PLAVIX) 75 MG tablet Take 75 mg by mouth daily.  . Cyanocobalamin (VITAMIN B-12) 1000 MCG SUBL Place 1,000 mcg under the tongue daily.  Marland Kitchen gabapentin (NEURONTIN) 800 MG tablet Take 800-1,600 mg by mouth 3 (three) times daily. Takes 800 mg twice a day and 1600 in the evening.  Marland Kitchen glipiZIDE (GLUCOTROL) 5 MG tablet Take 5 mg by mouth 2 (two) times daily before a meal.    . guaiFENesin (MUCINEX) 600 MG 12 hr tablet Take 2 tablets (1,200 mg total) by mouth 2  (two) times daily.  . insulin glargine (LANTUS) 100 UNIT/ML injection Inject 60 Units into the skin at bedtime.  . metFORMIN (GLUCOPHAGE) 500 MG tablet Take 500 mg by mouth 3 (three) times daily.   . methocarbamol (ROBAXIN) 500 MG tablet Take 1-2 tablets qid as needed for muscle spasms  . nicotine (NICODERM CQ - DOSED IN MG/24 HOURS) 21 mg/24hr patch Place 1 patch onto the skin daily.  . ondansetron (ZOFRAN) 8 MG tablet Take 8 mg by mouth every 8 (eight) hours as needed for nausea.  Marland Kitchen oxyCODONE-acetaminophen (PERCOCET) 5-325 MG tablet Take 1 tablet by mouth every 4 (four) hours as needed for severe pain.  Marland Kitchen solifenacin (VESICARE) 5 MG tablet Take 1 tablet (5 mg total) by mouth daily.    Past Medical History:  Diagnosis Date  . Chronic back pain   . Chronic shoulder pain   . Diabetes mellitus   . High cholesterol   . High cholesterol   . Neuropathy ([place])   . Neuropathy ([place])   . Tachycardia     Past Surgical History:  Procedure Laterality Date  . ABDOMINAL HYSTERECTOMY    . BACK SURGERY    . GALLBLADDER SURGERY    . ILIAC ARTERY STENT    . SHOULDER SURGERY      Social History Social  History  Substance Use Topics  . Smoking status: Current Every Day Smoker    Packs/day: 1.00    Types: Cigarettes  . Smokeless tobacco: Never Used  . Alcohol use Yes    Family History Family History  Problem Relation Age of Onset  . Hypertension Mother   . Diabetes Mother   No family history of bleeding/clotting disorders, porphyria or autoimmune disease  No Known Allergies   REVIEW OF SYSTEMS (Negative unless checked)  Constitutional: [] Weight loss  [] Fever  [] Chills Cardiac: [] Chest pain   [] Chest pressure   [] Palpitations   [] Shortness of breath when laying flat   [x] Shortness of breath with exertion. Vascular:  [x] Pain in legs with walking   [x] Pain in legs at rest  [] History of DVT   [] Phlebitis   [x] Swelling in legs   [] Varicose veins   [] Non-healing ulcers Pulmonary:   [] Uses  home oxygen   [x] cough   [] Hemoptysis   [] Wheeze  [] COPD   [] Asthma Neurologic:  [] Dizziness   [] Seizures   [] History of stroke   [] History of TIA  [] Aphasia   [] Vissual changes   [] Weakness or numbness in arm   [] Weakness or numbness in leg Musculoskeletal:   [] Joint swelling   [x] Joint pain   [x] Low back pain Hematologic:  [x] Easy bruising  [] Easy bleeding   [] Hypercoagulable state   [] Anemic Gastrointestinal:  [] Diarrhea   [] Vomiting  [] Gastroesophageal reflux/heartburn   [] Difficulty swallowing. Genitourinary:  [] Chronic kidney disease   [] Difficult urination  [] Frequent urination   [] Blood in urine Skin:  [] Rashes   [] Ulcers  Psychological:  [] History of anxiety   []  History of major depression.  Physical Examination  Vitals:   04/20/16 1346  BP: 130/71  Pulse: 81  Resp: 16  Weight: 175 lb (79.4 kg)  Height: 5' 5.5" (1.664 m)   Body mass index is 28.68 kg/m. Gen: WD/WN, NAD Head: [place]/AT, No temporalis wasting.  Ear/Nose/Throat: Hearing grossly intact, nares w/o erythema or drainage, poor dentition Eyes: PER, EOMI, sclera nonicteric.  Neck: Supple, no masses.  No bruit or JVD.  Pulmonary:  Good air movement, rhonchi bilaterally by auscultation bilaterally, no use of accessory muscles.  Cardiac: RRR, normal S1, S2, no Murmurs. Vascular:  Bilateral carotid bruits Vessel Right Left  Radial Palpable Palpable  Ulnar Palpable Palpable  Brachial Palpable Palpable  Carotid Palpable Palpable  Femoral Palpable Palpable  Popliteal Palpable Palpable  PT Trace Palpable Trace Palpable  DP Trace Palpable Trace Palpable   Gastrointestinal: soft, non-distended. No guarding/no peritoneal signs.  Musculoskeletal: M/S 5/5 throughout.  No deformity or atrophy.  Neurologic: CN 2-12 intact. Pain and light touch intact in extremities.  Symmetrical.  Speech is fluent. Motor exam as listed above. Psychiatric: Judgment intact, Mood & affect appropriate for pt's clinical situation. Dermatologic: No  rashes or ulcers noted.  No changes consistent with cellulitis. Lymph : No Cervical lymphadenopathy, no lichenification or skin changes of chronic lymphedema.  CBC Lab Results  Component Value Date   WBC 12.0 (H) 12/18/2014   HGB 15.3 12/18/2014   HCT 45.5 12/18/2014   MCV 91.4 12/18/2014   PLT 200 12/18/2014    BMET    Component Value Date/Time   NA 135 12/18/2014 1613   K 3.9 12/18/2014 1613   CL 97 (L) 12/18/2014 1613   CO2 29 12/18/2014 1613   GLUCOSE 409 (H) 12/18/2014 1613   BUN 16 12/18/2014 1613   CREATININE 0.76 12/18/2014 1613   CALCIUM 9.4 12/18/2014 1613   GFRNONAA >60 12/18/2014  [place] 12/18/2014 1613   CrCl cannot be calculated (Patient's most recent lab result is older than the maximum 21 days allowed.).  COAG No results found for: INR, PROTIME  Radiology No results found.  Assessment/Plan 1. Bilateral carotid artery stenosis Recommend:  Given the patient's asymptomatic bruit no further invasive testing or surgery at this time.  Duplex ultrasound will be obtained.  Continue antiplatelet therapy as prescribed Continue management of CAD, HTN and Hyperlipidemia Healthy heart diet,  encouraged exercise at least 4 times per week Follow up in  with duplex ultrasound   - VAS US CAROTID; Future  2. Leg Pain  Recommend:  The patient has atypical pain symptoms for pure atherosclerotic disease. However, on physical exam there is evidence of mixed venous and arterial disease, given the diminished pulses and the edema associated with venous changes of the legs.  Noninvasive studies including ABI's and aorta iliac ultrasound of the legs will be obtained and the patient will follow up with me to review these studies.  The patient should continue walking and begin a more formal exercise program. The patient should continue his antiplatelet therapy and aggressive treatment of the lipid abnormalities.  The patient should begin wearing graduated  compression socks 15-20 mmHg strength to control edema.   3. Essential (primary) hypertension Continue antihypertensive medications as already ordered, these medications have been reviewed and there are no changes at this time.  4. Chronic obstructive pulmonary disease, unspecified COPD type ([place]) Continue pulmonary medications and aerosols as already ordered, these medications have been reviewed and there are no changes at this time.  5. Type 2 diabetes mellitus with complication, with long-term current use of insulin (HCC) Continue hypoglycemic medications as already ordered, these medications have been reviewed and there are no changes at this time.  Hgb A1C to be monitored as already arranged by primary service   Hortencia Pilar, MD  04/20/2016 2:32 PM

## 2016-04-27 ENCOUNTER — Inpatient Hospital Stay: Payer: Medicaid Other | Admitting: Hematology and Oncology

## 2016-04-27 NOTE — Progress Notes (Deleted)
[facility] day:  04/27/2016  Chief Complaint: Autumn Faulkner is a 56 y.o. female with leukocytosis who is referred by Dr. Maryland Pink for assessment and management.  HPI: ***  Patient has had an elevated WBC since 09/06/2015.  WBC was 11,400 on 06/26/2017and 16,900 on 12/20/2015.  Hematocrit was elevated on 10/09/02017 at 48.9 with a hemoglobin of 16.9.  CBC on 04/18/2016 revealed a hematocrit of 47.8, hemoglobin 15.8, MCV 90.4, platelets 244,000, WBC 17,100 with an ANC of 12,500.  Differential included 73% segs, 24% lymphocytes and 3% mixed.  She had an E coli UTI on 09/06/2015.  Additional labs on 04/18/2016 revealed a normal CMP except for a blood sugar of 305.  Hemoglobin AiC was 12.2.  Past Medical History:  Diagnosis Date  . Chronic back pain   . Chronic shoulder pain   . Diabetes mellitus   . High cholesterol   . High cholesterol   . Neuropathy ([place])   . Neuropathy ([place])   . Tachycardia     Past Surgical History:  Procedure Laterality Date  . ABDOMINAL HYSTERECTOMY    . BACK SURGERY    . GALLBLADDER SURGERY    . ILIAC ARTERY STENT    . SHOULDER SURGERY      Family History  Problem Relation Age of Onset  . Hypertension Mother   . Diabetes Mother     Social History:  reports that she has been smoking Cigarettes.  She has been smoking about 1.00 pack per day. She has never used smokeless tobacco. She reports that she drinks alcohol. She reports that she does not use drugs.  The patient is accompanied by *** alone today.  Allergies: No Known Allergies  Current Medications: Current Outpatient Prescriptions  Medication Sig Dispense Refill  . ALPRAZolam (XANAX) 1 MG tablet Take 0.5-1 mg by mouth 2 (two) times daily as needed for sleep or anxiety (Always takes 1 mg in the evening. May take 0.5 mg during the day .).     Marland Kitchen aspirin EC 81 MG tablet Take 81 mg by mouth at bedtime.    Marland Kitchen atenolol (TENORMIN) 50 MG tablet Take 50 mg  by mouth daily.    Marland Kitchen atorvastatin (LIPITOR) 40 MG tablet Take 40 mg by mouth daily.    . clopidogrel (PLAVIX) 75 MG tablet Take 75 mg by mouth daily.    . Cyanocobalamin (VITAMIN B-12) 1000 MCG SUBL Place 1,000 mcg under the tongue daily.    Marland Kitchen gabapentin (NEURONTIN) 800 MG tablet Take 800-1,600 mg by mouth 3 (three) times daily. Takes 800 mg twice a day and 1600 in the evening.    Marland Kitchen glipiZIDE (GLUCOTROL) 5 MG tablet Take 5 mg by mouth 2 (two) times daily before a meal.      . guaiFENesin (MUCINEX) 600 MG 12 hr tablet Take 2 tablets (1,200 mg total) by mouth 2 (two) times daily. 20 tablet 0  . insulin glargine (LANTUS) 100 UNIT/ML injection Inject 60 Units into the skin at bedtime.    . metFORMIN (GLUCOPHAGE) 500 MG tablet Take 500 mg by mouth 3 (three) times daily.     . methocarbamol (ROBAXIN) 500 MG tablet Take 1-2 tablets qid as needed for muscle spasms 30 tablet 0  . nicotine (NICODERM CQ - DOSED IN MG/24 HOURS) 21 mg/24hr patch Place 1 patch onto the skin daily. 28 patch 0  . ondansetron (ZOFRAN) 8 MG tablet Take 8 mg by mouth every 8 (eight) hours as  needed for nausea.    Marland Kitchen oxyCODONE-acetaminophen (PERCOCET) 5-325 MG tablet Take 1 tablet by mouth every 4 (four) hours as needed for severe pain. 8 tablet 0  . solifenacin (VESICARE) 5 MG tablet Take 1 tablet (5 mg total) by mouth daily. 30 tablet 11   No current facility-administered medications for this visit.     Review of Systems:  GENERAL:  Feels good.  Active.  No fevers, sweats or weight loss. PERFORMANCE STATUS (ECOG):  *** HEENT:  No visual changes, runny nose, sore throat, mouth sores or tenderness. Lungs: No shortness of breath or cough.  No hemoptysis. Cardiac:  No chest pain, palpitations, orthopnea, or PND. GI:  No nausea, vomiting, diarrhea, constipation, melena or hematochezia. GU:  No urgency, frequency, dysuria, or hematuria. Musculoskeletal:  No back pain.  No joint pain.  No muscle tenderness. Extremities:  No pain or  swelling. Skin:  No rashes or skin changes. Neuro:  No headache, numbness or weakness, balance or coordination issues. Endocrine:  No diabetes, thyroid issues, hot flashes or night sweats. Psych:  No mood changes, depression or anxiety. Pain:  No focal pain. Review of systems:  All other systems reviewed and found to be negative.   Physical Exam: There were no vitals taken for this visit. GENERAL:  Well developed, well nourished, sitting comfortably in the exam room in no acute distress. MENTAL STATUS:  Alert and oriented to person, place and time. HEAD:  *** hair.  Normocephalic, atraumatic, face symmetric, no Cushingoid features. EYES:  *** eyes.  Pupils equal round and reactive to light and accomodation.  No conjunctivitis or scleral icterus. ENT:  Oropharynx clear without lesion.  Tongue normal. Mucous membranes moist.  RESPIRATORY:  Clear to auscultation without rales, wheezes or rhonchi. CARDIOVASCULAR:  Regular rate and rhythm without murmur, rub or gallop. ABDOMEN:  Soft, non-tender, with active bowel sounds, and no hepatosplenomegaly.  No masses. SKIN:  No rashes, ulcers or lesions. EXTREMITIES: No edema, no skin discoloration or tenderness.  No palpable cords. LYMPH NODES: No palpable cervical, supraclavicular, axillary or inguinal adenopathy  NEUROLOGICAL: Unremarkable. PSYCH:  Appropriate.  No visits with results within 3 Day(s) from this visit.  Latest known visit with results is:  Office Visit on 09/27/2015  Component Date Value Ref Range Status  . Scan Result 09/27/2015 25   Final  . Specific Gravity, UA 09/27/2015 >1.030* 1.005 - 1.030 Final  . pH, UA 09/27/2015 5.0  5.0 - 7.5 Final  . Color, UA 09/27/2015 Yellow  Yellow Final  . Appearance Ur 09/27/2015 Clear  Clear Final  . Leukocytes, UA 09/27/2015 Negative  Negative Final  . Protein, UA 09/27/2015 Negative  Negative/Trace Final  . Glucose, UA 09/27/2015 2+* Negative Final  . Ketones, UA 09/27/2015 Negative   Negative Final  . RBC, UA 09/27/2015 Negative  Negative Final  . Bilirubin, UA 09/27/2015 Negative  Negative Final  . Urobilinogen, Ur 09/27/2015 0.2  0.2 - 1.0 mg/dL Final  . Nitrite, UA 09/27/2015 Negative  Negative Final  . Microscopic Examination 09/27/2015 See below:   Final  . WBC, UA 09/27/2015 None seen  0 - 5 /hpf Final  . RBC, UA 09/27/2015 None seen  0 - 2 /hpf Final  . Epithelial Cells (non renal) 09/27/2015 0-10  0 - 10 /hpf Final  . Bacteria, UA 09/27/2015 None seen  None seen/Few Final    Assessment:  Autumn Faulkner is a 56 y.o. female ***  Plan: 1. *** 2. *** 3. *** 4. ***  5. ***  Lequita Asal, MD  04/27/2016, 2:50 AM

## 2016-05-05 ENCOUNTER — Ambulatory Visit: Payer: Medicaid Other | Admitting: Hematology and Oncology

## 2016-05-25 ENCOUNTER — Inpatient Hospital Stay: Payer: Medicaid Other

## 2016-05-25 ENCOUNTER — Inpatient Hospital Stay: Payer: Medicaid Other | Attending: Hematology and Oncology | Admitting: Hematology and Oncology

## 2016-05-25 ENCOUNTER — Encounter: Payer: Self-pay | Admitting: Hematology and Oncology

## 2016-05-25 ENCOUNTER — Encounter (INDEPENDENT_AMBULATORY_CARE_PROVIDER_SITE_OTHER): Payer: Self-pay

## 2016-05-25 VITALS — BP 115/72 | HR 76 | Temp 98.4°F | Ht 65.0 in | Wt 182.4 lb

## 2016-05-25 DIAGNOSIS — R5383 Other fatigue: Secondary | ICD-10-CM | POA: Insufficient documentation

## 2016-05-25 DIAGNOSIS — Z7982 Long term (current) use of aspirin: Secondary | ICD-10-CM | POA: Insufficient documentation

## 2016-05-25 DIAGNOSIS — F17210 Nicotine dependence, cigarettes, uncomplicated: Secondary | ICD-10-CM

## 2016-05-25 DIAGNOSIS — E1165 Type 2 diabetes mellitus with hyperglycemia: Secondary | ICD-10-CM | POA: Diagnosis not present

## 2016-05-25 DIAGNOSIS — R634 Abnormal weight loss: Secondary | ICD-10-CM | POA: Insufficient documentation

## 2016-05-25 DIAGNOSIS — Z79899 Other long term (current) drug therapy: Secondary | ICD-10-CM | POA: Diagnosis not present

## 2016-05-25 DIAGNOSIS — M549 Dorsalgia, unspecified: Secondary | ICD-10-CM | POA: Diagnosis not present

## 2016-05-25 DIAGNOSIS — R509 Fever, unspecified: Secondary | ICD-10-CM | POA: Diagnosis not present

## 2016-05-25 DIAGNOSIS — Z87440 Personal history of urinary (tract) infections: Secondary | ICD-10-CM | POA: Diagnosis not present

## 2016-05-25 DIAGNOSIS — R05 Cough: Secondary | ICD-10-CM | POA: Insufficient documentation

## 2016-05-25 DIAGNOSIS — H9202 Otalgia, left ear: Secondary | ICD-10-CM | POA: Insufficient documentation

## 2016-05-25 DIAGNOSIS — D72829 Elevated white blood cell count, unspecified: Secondary | ICD-10-CM | POA: Insufficient documentation

## 2016-05-25 DIAGNOSIS — G8929 Other chronic pain: Secondary | ICD-10-CM | POA: Insufficient documentation

## 2016-05-25 DIAGNOSIS — R112 Nausea with vomiting, unspecified: Secondary | ICD-10-CM

## 2016-05-25 DIAGNOSIS — K047 Periapical abscess without sinus: Secondary | ICD-10-CM | POA: Insufficient documentation

## 2016-05-25 DIAGNOSIS — M25519 Pain in unspecified shoulder: Secondary | ICD-10-CM | POA: Insufficient documentation

## 2016-05-25 DIAGNOSIS — Z205 Contact with and (suspected) exposure to viral hepatitis: Secondary | ICD-10-CM | POA: Diagnosis not present

## 2016-05-25 DIAGNOSIS — G629 Polyneuropathy, unspecified: Secondary | ICD-10-CM | POA: Insufficient documentation

## 2016-05-25 DIAGNOSIS — R093 Abnormal sputum: Secondary | ICD-10-CM | POA: Diagnosis not present

## 2016-05-25 DIAGNOSIS — E7800 Pure hypercholesterolemia, unspecified: Secondary | ICD-10-CM | POA: Diagnosis not present

## 2016-05-25 DIAGNOSIS — Z794 Long term (current) use of insulin: Secondary | ICD-10-CM | POA: Diagnosis not present

## 2016-05-25 DIAGNOSIS — R3915 Urgency of urination: Secondary | ICD-10-CM | POA: Insufficient documentation

## 2016-05-25 DIAGNOSIS — Z86010 Personal history of colonic polyps: Secondary | ICD-10-CM | POA: Insufficient documentation

## 2016-05-25 LAB — CBC WITH DIFFERENTIAL/PLATELET
Basophils Absolute: 0.1 10*3/uL (ref 0–0.1)
Basophils Relative: 1 %
Eosinophils Absolute: 0.2 10*3/uL (ref 0–0.7)
Eosinophils Relative: 1 %
HCT: 41.7 % (ref 35.0–47.0)
Hemoglobin: 14.1 g/dL (ref 12.0–16.0)
Lymphocytes Relative: 27 %
Lymphs Abs: 4.2 10*3/uL — ABNORMAL HIGH (ref 1.0–3.6)
MCH: 30.5 pg (ref 26.0–34.0)
MCHC: 33.8 g/dL (ref 32.0–36.0)
MCV: 90.2 fL (ref 80.0–100.0)
Monocytes Absolute: 0.6 10*3/uL (ref 0.2–0.9)
Monocytes Relative: 4 %
Neutro Abs: 10.6 10*3/uL — ABNORMAL HIGH (ref 1.4–6.5)
Neutrophils Relative %: 67 %
Platelets: 245 10*3/uL (ref 150–440)
RBC: 4.62 MIL/uL (ref 3.80–5.20)
RDW: 14.5 % (ref 11.5–14.5)
WBC: 15.6 10*3/uL — ABNORMAL HIGH (ref 3.6–11.0)

## 2016-05-25 LAB — COMPREHENSIVE METABOLIC PANEL
ALT: 13 U/L — ABNORMAL LOW (ref 14–54)
AST: 18 U/L (ref 15–41)
Albumin: 3.9 g/dL (ref 3.5–5.0)
Alkaline Phosphatase: 81 U/L (ref 38–126)
Anion gap: 7 (ref 5–15)
BUN: 22 mg/dL — ABNORMAL HIGH (ref 6–20)
CO2: 28 mmol/L (ref 22–32)
Calcium: 9.3 mg/dL (ref 8.9–10.3)
Chloride: 106 mmol/L (ref 101–111)
Creatinine, Ser: 0.63 mg/dL (ref 0.44–1.00)
GFR calc Af Amer: >60 mL/min (ref >60)
GFR calc non Af Amer: >60 mL/min (ref >60)
Glucose, Bld: 165 mg/dL — ABNORMAL HIGH (ref 65–99)
Potassium: 3.6 mmol/L (ref 3.5–5.1)
Sodium: 141 mmol/L (ref 135–145)
Total Bilirubin: 0.3 mg/dL (ref 0.3–1.2)
Total Protein: 7.3 g/dL (ref 6.5–8.1)

## 2016-05-25 LAB — SEDIMENTATION RATE: Sed Rate: 9 mm/hr (ref 0–30)

## 2016-05-25 NOTE — Progress Notes (Signed)
[facility] day:  05/25/2016  Chief Complaint: Autumn Faulkner is a 56 y.o. female with leukocytosis who is referred by Dr. Maryland Pink for assessment and management.  HPI:  The patient notes a history of an elevated WBC count for 5-8 years.  She states that her blood has been checked on regular follow-up visits and has been high.  She does note a history of infections.  She has had several UTIs in the past 2 years.  She had 3 UTIs in the past 6-8 months.  She had pneumonia 3 years ago.  She states that she has had trouble with her gums and teeth.  She states that she had an abscessed tooth on the right side years ago.  The tooth was pulled.  Another tooth fell out, but the roots remain.  She states that she has had a right sided dental abscess for at least a month.  She made an appointment with the dentist 1 month ago, but went on a vacation to [place] for 3 weeks.  She self medicated with her brother-in-law's amoxicillin.  Symptoms improved antibiotics.  She had a pain prescription (Percocet), but it was taken (? by her sister).  She notes that her mouth, ear, and throat hurt.  She notes fevers that come and go.  Last fever was 102.1 on 05/23/2016.  She has lost 38 pounds in the past 8 months secondary to nausea and dental issues.  She notes chronic mucus in her chest (brown and yellow in color).  She is smoking 1 pack a day.  She denies any alcohol or drugs.  She denies any exposure to radiation or toxins.  Her husband had hepatitis C.  She was tested 4-5 years ago and was negative.  She denies any family history of blood disorders.  Labs indicate an elevated WBC since 09/06/2015.  WBC was 11,400 on 09/06/2015 and 16,900 on 12/20/2015.  Hematocrit was elevated on 10/09/02017 at 48.9 with a hemoglobin of 16.9.  CBC on 04/18/2016 revealed a hematocrit of 47.8, hemoglobin 15.8, MCV 90.4, platelets 244,000, WBC 17,100 with an ANC of 12,500.  Differential  included 73% segs, 24% lymphocytes and 3% mixed.  She had an E coli UTI on 09/06/2015.  Additional labs on 04/18/2016 revealed a normal CMP except for a blood sugar of 305.  Hemoglobin A1C was 12.2.  Last colonoscopy was in 06/2016.  She states that she has polyps.  She has not had a mammogram in 8 years.   Past Medical History:  Diagnosis Date  . Chronic back pain   . Chronic shoulder pain   . Diabetes mellitus   . High cholesterol   . High cholesterol   . Neuropathy ([place])   . Neuropathy ([place])   . Tachycardia     Past Surgical History:  Procedure Laterality Date  . ABDOMINAL HYSTERECTOMY    . BACK SURGERY    . GALLBLADDER SURGERY    . ILIAC ARTERY STENT    . SHOULDER SURGERY      Family History  Problem Relation Age of Onset  . Hypertension Mother   . Diabetes Mother     Social History:  reports that she has been smoking Cigarettes.  She has been smoking about 1.00 pack per day. She has never used smokeless tobacco. She reports that she drinks alcohol. She reports that she does not use drugs.  She began smoking at age 52.  She previously smoked 3 packs/day.  She is currently smoking 1 pack/day.  She denies any exposure to radiation or toxins.  She recently returned from a 3 week vacation in [place].  The patient is alone today.  Allergies: No Known Allergies  Current Medications: Current Outpatient Prescriptions  Medication Sig Dispense Refill  . ALPRAZolam (XANAX) 1 MG tablet Take 0.5-1 mg by mouth 2 (two) times daily as needed for sleep or anxiety (Always takes 1 mg in the evening. May take 0.5 mg during the day .).     Marland Kitchen aspirin EC 81 MG tablet Take 81 mg by mouth at bedtime.    Marland Kitchen atenolol (TENORMIN) 50 MG tablet Take 50 mg by mouth daily.    Marland Kitchen atorvastatin (LIPITOR) 40 MG tablet Take 40 mg by mouth daily.    . cloNIDine-chlorthalidone (CLORPRES) 0.1-15 MG tablet Take 1 tablet by mouth 2 (two) times daily.    . clopidogrel (PLAVIX) 75 MG tablet Take 75 mg by mouth  daily.    . Cyanocobalamin (VITAMIN B-12) 1000 MCG SUBL Place 1,000 mcg under the tongue daily.    Marland Kitchen gabapentin (NEURONTIN) 800 MG tablet Take 800-1,600 mg by mouth 3 (three) times daily. Takes 800 mg twice a day and 1600 in the evening.    . insulin glargine (LANTUS) 100 UNIT/ML injection Inject 60 Units into the skin at bedtime.    . metFORMIN (GLUCOPHAGE) 500 MG tablet Take 500 mg by mouth 3 (three) times daily.     . ondansetron (ZOFRAN) 8 MG tablet Take 8 mg by mouth every 8 (eight) hours as needed for nausea.    Marland Kitchen oxyCODONE-acetaminophen (PERCOCET) 10-650 MG tablet Take 1 tablet by mouth every 6 (six) hours as needed for pain.     No current facility-administered medications for this visit.     Review of Systems:  GENERAL:  Fatigued appearing.  Fever come and go, last 2 days ago (see HPI).  No sweats.  Weight loss of 38 pounds in 8 months. PERFORMANCE STATUS (ECOG):  1 HEENT:  Dental issues (see HPI).  Throat and left ear pain.  Nasal congestion.  Poor vision secondary to diabetes. Lungs: Shortness of breath at times.  Chronic cough.  Mucus thick, brown and sometimes yellow.  No hemoptysis. Cardiac:  No chest pain, palpitations, orthopnea, or PND. GI:  Emesis x 1 last night.  Constipation.  Stools sometimes dark.  No diarrhea, melena or hematochezia.  Polyps.  Last colonoscopy 06/2016. GU:  Chronic urgency/full feeling.  No frequency, dysuria, or hematuria. Breasts:  No breast concerns.  Last mammogram 8 years ago. Musculoskeletal:  Back fusion.  No joint pain.  No muscle tenderness. Extremities:  Tender legs s/p stents.  Noswelling. Skin:  Scabs on shoulders.  No rashes. Neuro:  No headache, numbness or weakness, balance or coordination issues. Endocrine:  Diabetes, poorly controlled.  No thyroid issues, hot flashes or night sweats. Psych:  No mood changes, depression or anxiety. Pain:  Left sided tooth ache.  Left ear pain. Review of systems:  All other systems reviewed and found  to be negative.  Physical Exam: Blood pressure 115/72, pulse 76, temperature 98.4 F (36.9 C), temperature source Tympanic, height '5\' 5"'  (1.651 m), weight 182 lb 6.5 oz (82.7 kg). GENERAL:  Well developed, well nourished, woman sitting comfortably in the exam room in no acute distress.  She has her hand on her left cheek. MENTAL STATUS:  Alert and oriented to person, place and time. HEAD:  Long blonde hair.  Normocephalic, atraumatic, face  symmetric, no Cushingoid features. EYES:  Blue eyes.  Pupils equal round and reactive to light and accomodation.  No conjunctivitis or scleral icterus. ENT:  Upper left molar with hole.  No gum swelling.  Tongue normal. Mucous membranes moist.  Tympanic membranes benign. RESPIRATORY:  Clear to auscultation without rales, wheezes or rhonchi. CARDIOVASCULAR:  Regular rate and rhythm without murmur, rub or gallop. ABDOMEN:  Soft, non-tender, with active bowel sounds, and no appreciable hepatosplenomegaly.  No masses. SKIN:  No rashes, ulcers or lesions. EXTREMITIES: No edema, no skin discoloration or tenderness.  No palpable cords. LYMPH NODES: No palpable cervical, supraclavicular, axillary or inguinal adenopathy  NEUROLOGICAL: Unremarkable. PSYCH:  Appropriate.   Office Visit on 05/25/2016  Component Date Value Ref Range Status  . WBC 05/25/2016 15.6* 3.6 - 11.0 K/uL Final  . RBC 05/25/2016 4.62  3.80 - 5.20 MIL/uL Final  . Hemoglobin 05/25/2016 14.1  12.0 - 16.0 g/dL Final  . HCT 05/25/2016 41.7  35.0 - 47.0 % Final  . MCV 05/25/2016 90.2  80.0 - 100.0 fL Final  . MCH 05/25/2016 30.5  26.0 - 34.0 pg Final  . MCHC 05/25/2016 33.8  32.0 - 36.0 g/dL Final  . RDW 05/25/2016 14.5  11.5 - 14.5 % Final  . Platelets 05/25/2016 245  150 - 440 K/uL Final  . Neutrophils Relative % 05/25/2016 67  % Final  . Neutro Abs 05/25/2016 10.6* 1.4 - 6.5 K/uL Final  . Lymphocytes Relative 05/25/2016 27  % Final  . Lymphs Abs 05/25/2016 4.2* 1.0 - 3.6 K/uL Final  .  Monocytes Relative 05/25/2016 4  % Final  . Monocytes Absolute 05/25/2016 0.6  0.2 - 0.9 K/uL Final  . Eosinophils Relative 05/25/2016 1  % Final  . Eosinophils Absolute 05/25/2016 0.2  0 - 0.7 K/uL Final  . Basophils Relative 05/25/2016 1  % Final  . Basophils Absolute 05/25/2016 0.1  0 - 0.1 K/uL Final  . Sodium 05/25/2016 141  135 - 145 mmol/L Final  . Potassium 05/25/2016 3.6  3.5 - 5.1 mmol/L Final  . Chloride 05/25/2016 106  101 - 111 mmol/L Final  . CO2 05/25/2016 28  22 - 32 mmol/L Final  . Glucose, Bld 05/25/2016 165* 65 - 99 mg/dL Final  . BUN 05/25/2016 22* 6 - 20 mg/dL Final  . Creatinine, Ser 05/25/2016 0.63  0.44 - 1.00 mg/dL Final  . Calcium 05/25/2016 9.3  8.9 - 10.3 mg/dL Final  . Total Protein 05/25/2016 7.3  6.5 - 8.1 g/dL Final  . Albumin 05/25/2016 3.9  3.5 - 5.0 g/dL Final  . AST 05/25/2016 18  15 - 41 U/L Final  . ALT 05/25/2016 13* 14 - 54 U/L Final  . Alkaline Phosphatase 05/25/2016 81  38 - 126 U/L Final  . Total Bilirubin 05/25/2016 0.3  0.3 - 1.2 mg/dL Final  . GFR calc non Af Amer 05/25/2016 >60  >60 mL/min Final  . GFR calc Af Amer 05/25/2016 >60  >60 mL/min Final   Comment: (NOTE) The eGFR has been calculated using the CKD EPI equation. This calculation has not been validated in all clinical situations. eGFR's persistently <60 mL/min signify possible Chronic Kidney Disease.   . Anion gap 05/25/2016 7  5 - 15 Final  . Sed Rate 05/25/2016 9  0 - 30 mm/hr Final  . HIV Screen 4th Generation wRfx 05/25/2016 Non Reactive  Non Reactive Final   Comment: (NOTE) Performed At: [facility] [address] [place], [place] [id number] Evette Doffing  Darrick Penna MD TD:[phone]     Assessment:  Shironda Kain is a 56 y.o. female a history of an elevated WBC count for 5-8 years.  WBC has ranged between 11,400 - 17,100.  She has a history of infections (UTI x 3 in past 6-8 months, pneumonia 3 years ago, chronic dental issues).  She smokes.  Her husband had hepatitis  C.  WBC was 11,400 on 09/06/2015 and 16,900 on 12/20/2015.  Hematocrit was elevated on 10/09/02017 at 48.9 with a hemoglobin of 16.9.  CBC on 04/18/2016 revealed a hematocrit of 47.8, hemoglobin 15.8, MCV 90.4, platelets 244,000, WBC 17,100 with an ANC of 12,500.  Differential included 73% segs, 24% lymphocytes and 3% mixed.  Last colonoscopy was in 06/2016.  She has had polyps.  She has not had a mammogram in 8 years.  Symptomatically, she notes fevers that come and go.  Last fever was 102.1 on 05/23/2016.  She has lost 38 pounds in the past 8 months secondary to nausea and dental issues.  She notes chronic mucus in her chest (brown and yellow in color).  She is smoking 1 pack a day.  Plan: 1.  Discuss work-up of chronic leukocytosis in the setting of chronic infections and smoking.  Patient believes that some episodes were not related to infections.  Discuss laboratory work-up. 2.  Discuss phone follow-up with her dentist to expedite evaluation and treatment.  Exam appears chronic.  No antibiotics or pain medications prescribed. 3.  Discuss consideration of low dose chest CT screening program.  Patient interested. 4.  Labs today:  CBC with diff, CMP, sed rate, BCR-ABL, JAK2, SPEP with immunoglobulins, hepatitis B core antibody, hepatitis C antibody, HIV testing. 5.  Referral to [facility] for low dose chest CT program. 6.  Encourage smoking cessation. 7.  Contact dentist that patient planned to see for evaluation. 8.  Discuss need for follow-up mammogram. 9.  RTC in 1 week to discuss results and direction of therapy.   Lequita Asal, MD  05/25/2016

## 2016-05-25 NOTE — Progress Notes (Signed)
Patient here for new evaluation she has a severe toothache.

## 2016-05-26 ENCOUNTER — Telehealth: Payer: Self-pay | Admitting: *Deleted

## 2016-05-26 LAB — HEPATITIS B CORE ANTIBODY, TOTAL: Hep B Core Total Ab: NEGATIVE

## 2016-05-26 LAB — HIV ANTIBODY (ROUTINE TESTING W REFLEX): HIV Screen 4th Generation wRfx: NONREACTIVE

## 2016-05-26 LAB — HEPATITIS C ANTIBODY: HCV Ab: <0.1 s/co ratio (ref 0.0–0.9)

## 2016-05-26 NOTE — Telephone Encounter (Signed)
Her dentist is Dr Alcario Drought at [phone] They cannoit see her until Tuesday @2 :30. She is asking if Dr Mike Gip will start her on some Abx since it is so long before she will be seen. Please advise

## 2016-05-26 NOTE — Telephone Encounter (Signed)
I called and spoke with Dr Juleen China office and they cannot see her sooner than Tuesday as she is a new patient to them and therefore cannot order her any antibiotics either. They suggested that her PCP call in abx for her and to let them know what was called in for their records. I attempted to call patient back and left VM for her to call Dr Verneda Skill office and Dr Alcario Drought to let them know if abx are ordered and to return my call as well so I know she received my message

## 2016-05-26 NOTE — Telephone Encounter (Signed)
Telephone Encounter - Weston Anna, LPN - 72/25/7505 1:83 PM EDT [facility] out of office-per [facility] OK to send in keflex 500 mg 1 po tid x 4 days only until pt's dentist appt-pt notified-RX sent in via E-RX to [facility]  Back to top of Miscellaneous Notes Telephone Encounter - Weston Anna, LPN - [id number]/82/5189 8:42 PM EDT [facility] 05-26-16 @ 1:23  Back to top of Miscellaneous Notes Telephone Encounter - Autumn Faulkner - 05/26/2016 12:18 PM EDT Pt checking status of previous message, and would like to know if something will be called in, [place] make only one trip to town. Please advise  Back to top of Miscellaneous Notes Telephone Encounter - Autumn Faulkner - 05/26/2016 11:16 AM EDT Pt has an abscess tooth and need some antibiotics, has an appt Tuesday @ dentist Dr. Alcario Drought & he won't prescribe , because she's a new pt w/ him.  Pt was recently seen @ her hematology Dr Dr. Kem Parkinson office for high white count yesterday, but that Dr. Is not in to call in anything today. Please advise

## 2016-05-26 NOTE — Telephone Encounter (Signed)
I have contacted Dr Verneda Skill office to ask that he call in abx for her

## 2016-05-29 ENCOUNTER — Telehealth: Payer: Self-pay | Admitting: *Deleted

## 2016-05-29 NOTE — Telephone Encounter (Signed)
Received referral for low dose lung cancer screening CT scan. Voicemail left at phone number listed in EMR for patient to call me back to facilitate scheduling scan.  

## 2016-05-30 LAB — MULTIPLE MYELOMA PANEL, SERUM
Albumin SerPl Elph-Mcnc: 3.5 g/dL (ref 2.9–4.4)
Albumin/Glob SerPl: 1.2 (ref 0.7–1.7)
Alpha 1: 0.2 g/dL (ref 0.0–0.4)
Alpha2 Glob SerPl Elph-Mcnc: 0.9 g/dL (ref 0.4–1.0)
B-Globulin SerPl Elph-Mcnc: 1 g/dL (ref 0.7–1.3)
Gamma Glob SerPl Elph-Mcnc: 0.8 g/dL (ref 0.4–1.8)
Globulin, Total: 3 g/dL (ref 2.2–3.9)
IgA: 175 mg/dL (ref 87–352)
IgG (Immunoglobin G), Serum: 706 mg/dL (ref 700–1600)
IgM, Serum: 63 mg/dL (ref 26–217)
Total Protein ELP: 6.5 g/dL (ref 6.0–8.5)

## 2016-06-01 ENCOUNTER — Inpatient Hospital Stay (HOSPITAL_BASED_OUTPATIENT_CLINIC_OR_DEPARTMENT_OTHER): Payer: Medicaid Other | Admitting: Hematology and Oncology

## 2016-06-01 VITALS — BP 104/62 | HR 77 | Resp 18 | Wt 180.3 lb

## 2016-06-01 DIAGNOSIS — E119 Type 2 diabetes mellitus without complications: Secondary | ICD-10-CM | POA: Diagnosis not present

## 2016-06-01 DIAGNOSIS — G629 Polyneuropathy, unspecified: Secondary | ICD-10-CM | POA: Diagnosis not present

## 2016-06-01 DIAGNOSIS — D72829 Elevated white blood cell count, unspecified: Secondary | ICD-10-CM

## 2016-06-01 DIAGNOSIS — F17210 Nicotine dependence, cigarettes, uncomplicated: Secondary | ICD-10-CM | POA: Diagnosis not present

## 2016-06-01 DIAGNOSIS — E7800 Pure hypercholesterolemia, unspecified: Secondary | ICD-10-CM | POA: Diagnosis not present

## 2016-06-01 DIAGNOSIS — Z794 Long term (current) use of insulin: Secondary | ICD-10-CM

## 2016-06-01 DIAGNOSIS — Z7982 Long term (current) use of aspirin: Secondary | ICD-10-CM

## 2016-06-01 DIAGNOSIS — R634 Abnormal weight loss: Secondary | ICD-10-CM | POA: Diagnosis not present

## 2016-06-01 DIAGNOSIS — Z86010 Personal history of colonic polyps: Secondary | ICD-10-CM

## 2016-06-01 DIAGNOSIS — K0889 Other specified disorders of teeth and supporting structures: Secondary | ICD-10-CM

## 2016-06-01 DIAGNOSIS — M25519 Pain in unspecified shoulder: Secondary | ICD-10-CM | POA: Diagnosis not present

## 2016-06-01 DIAGNOSIS — Z79899 Other long term (current) drug therapy: Secondary | ICD-10-CM

## 2016-06-01 DIAGNOSIS — G8929 Other chronic pain: Secondary | ICD-10-CM

## 2016-06-01 DIAGNOSIS — Z87440 Personal history of urinary (tract) infections: Secondary | ICD-10-CM

## 2016-06-01 DIAGNOSIS — M549 Dorsalgia, unspecified: Secondary | ICD-10-CM | POA: Diagnosis not present

## 2016-06-01 DIAGNOSIS — Z8701 Personal history of pneumonia (recurrent): Secondary | ICD-10-CM

## 2016-06-01 LAB — BCR-ABL1 FISH
Cells Analyzed: 200
Cells Counted: 200

## 2016-06-01 NOTE — Progress Notes (Signed)
Patient states she did not get her tooth pulled.  Dentist wanted to fill and crown the tooth.  Patient states she has called them back and has an appointment tomorrow to get it pulled.  Still having pain in left jaw.

## 2016-06-01 NOTE — Progress Notes (Signed)
[facility] day:  06/01/2016  Chief Complaint: Autumn Faulkner is a 56 y.o. female with leukocytosis who is seen for review of work-up and discussion regarding direction of therapy.  HPI:  The patient was last seen in the hematology clinic for initial consultation on 05/25/2016.  At that time, she was seen for initial consultation.  She had an elevated WBC count for 5-8 years.  WBC hadranged between 11,400 - 17,100.  She had a history of infections (UTI x 3 in past 6-8 months, pneumonia 3 years ago, chronic dental issues).  She smoked.  Her husband had hepatitis C.  Symptomatically, she noted fevers that come and go.  She had lost 38 pounds in the past 8 months secondary to nausea and dental issues.    She underwent a work-up.  CBC revealed a hematocrit of 41.7, hemoglobin 14.1, platelets 245,000, white count 15,600 with an ANC of 10,600. Differential included 67% segs, 27% lymphs, 4% monocytes, 1% eosinophils and 1% basophils.  ESR was 9.  SPEP revealed no M-spike.  Immunoglobulins were normal with an IgG of 706.  Hepatitis B core antobody total and hepatitis C antibody were negative.  HIV testing was negative.  She was prescribed Keflex 500 mg TID x 4 days on 05/26/2016 by Dr. Rogue Bussing until she was able to make her dentist appointment.  At last visit, smoking cessation was discussed.  We discussed referral to the low dose chest CT screening program.  We discussed follow-up with dentistry regarding her dental issues which appeared associated with her nausea and weight loss.  We discussed the need for annual mammogram.  Symptomatically, her mouth still hurts.  He has not had her tooth pulled.  She went to the dentist on Tuesday.  A panorex was performed.  Her root is "hanging out on the right".  The tooth throbs.  A left sided tooth is bad.  She has gum disease.  She needs a filling and a crown.   Past Medical History:  Diagnosis Date  . Chronic back  pain   . Chronic shoulder pain   . Diabetes mellitus   . High cholesterol   . High cholesterol   . Neuropathy ([place])   . Neuropathy ([place])   . Tachycardia     Past Surgical History:  Procedure Laterality Date  . ABDOMINAL HYSTERECTOMY    . BACK SURGERY    . GALLBLADDER SURGERY    . ILIAC ARTERY STENT    . SHOULDER SURGERY      Family History  Problem Relation Age of Onset  . Hypertension Mother   . Diabetes Mother     Social History:  reports that she has been smoking Cigarettes.  She has been smoking about 1.00 pack per day. She has never used smokeless tobacco. She reports that she drinks alcohol. She reports that she does not use drugs.  She began smoking at age 89.  She previously smoked 3 packs/day.  She is currently smoking 1 pack/day.  She denies any exposure to radiation or toxins.  She recently returned from a 3 week vacation in [place].  The patient is alone today.  Allergies: No Known Allergies  Current Medications: Current Outpatient Prescriptions  Medication Sig Dispense Refill  . ALPRAZolam (XANAX) 1 MG tablet Take 0.5-1 mg by mouth 2 (two) times daily as needed for sleep or anxiety (Always takes 1 mg in the evening. May take 0.5 mg during the day .).     Marland Kitchen  aspirin EC 81 MG tablet Take 81 mg by mouth at bedtime.    Marland Kitchen atenolol (TENORMIN) 50 MG tablet Take 50 mg by mouth daily.    Marland Kitchen atorvastatin (LIPITOR) 40 MG tablet Take 40 mg by mouth daily.    . cephALEXin (KEFLEX) 500 MG capsule Take 500 mg by mouth 3 (three) times daily.    . cloNIDine-chlorthalidone (CLORPRES) 0.1-15 MG tablet Take 1 tablet by mouth 2 (two) times daily.    . clopidogrel (PLAVIX) 75 MG tablet Take 75 mg by mouth daily.    . Cyanocobalamin (VITAMIN B-12) 1000 MCG SUBL Place 1,000 mcg under the tongue daily.    Marland Kitchen gabapentin (NEURONTIN) 800 MG tablet Take 800-1,600 mg by mouth 3 (three) times daily. Takes 800 mg twice a day and 1600 in the evening.    . insulin glargine (LANTUS) 100 UNIT/ML  injection Inject 60 Units into the skin at bedtime.    . metFORMIN (GLUCOPHAGE) 500 MG tablet Take 500 mg by mouth 3 (three) times daily.     . ondansetron (ZOFRAN) 8 MG tablet Take 8 mg by mouth every 8 (eight) hours as needed for nausea.    Marland Kitchen oxyCODONE-acetaminophen (PERCOCET) 10-650 MG tablet Take 1 tablet by mouth every 6 (six) hours as needed for pain.     No current facility-administered medications for this visit.     Review of Systems:  GENERAL:  Fatigued.  Fever come and go.  No sweats.  Weight loss of 2 pounds since last visit (38 pounds in 8 months). PERFORMANCE STATUS (ECOG):  1 HEENT:  Dental issues (see HPI).  Jaw pain.  Nasal congestion.  Poor vision secondary to diabetes. Lungs: Shortness of breath at times.  Chronic cough.  No hemoptysis. Cardiac:  No chest pain, palpitations, orthopnea, or PND. GI:  Stools sometimes dark.  No nausea, vomiting, constipation, diarrhea, melena or hematochezia.  Polyps.  Last colonoscopy 05/2014. GU:  Chronic urgency/full feeling.  No frequency, dysuria, or hematuria. Breasts:  No breast concerns.  Last mammogram 8 years ago. Musculoskeletal:  Back fusion.  No joint pain.  No muscle tenderness. Extremities:  Tender legs s/p stents.  Noswelling. Skin:  Scabs on shoulders.  No rashes. Neuro:  No headache, numbness or weakness, balance or coordination issues. Endocrine:  Diabetes, poorly controlled.  No thyroid issues, hot flashes or night sweats. Psych:  No mood changes, depression or anxiety. Pain:  Left sided tooth/jaw ache.  Left ear pain. Review of systems:  All other systems reviewed and found to be negative.  Physical Exam: Blood pressure 104/62, pulse 77, resp. rate 18, weight 180 lb 5 oz (81.8 kg). GENERAL:  Well developed, well nourished, woman sitting comfortably in the exam room in no acute distress. MENTAL STATUS:  Alert and oriented to person, place and time. HEAD: Red/blonde hair.  Normocephalic, atraumatic, face symmetric, no  Cushingoid features. EYES:  Blue eyes.  No conjunctivitis or scleral icterus. NEUROLOGICAL: Unremarkable. PSYCH:  Appropriate.   No visits with results within 3 Day(s) from this visit.  Latest known visit with results is:  Office Visit on 05/25/2016  Component Date Value Ref Range Status  . WBC 05/25/2016 15.6* 3.6 - 11.0 K/uL Final  . RBC 05/25/2016 4.62  3.80 - 5.20 MIL/uL Final  . Hemoglobin 05/25/2016 14.1  12.0 - 16.0 g/dL Final  . HCT 05/25/2016 41.7  35.0 - 47.0 % Final  . MCV 05/25/2016 90.2  80.0 - 100.0 fL Final  . MCH 05/25/2016 30.5  26.0 -  34.0 pg Final  . MCHC 05/25/2016 33.8  32.0 - 36.0 g/dL Final  . RDW 05/25/2016 14.5  11.5 - 14.5 % Final  . Platelets 05/25/2016 245  150 - 440 K/uL Final  . Neutrophils Relative % 05/25/2016 67  % Final  . Neutro Abs 05/25/2016 10.6* 1.4 - 6.5 K/uL Final  . Lymphocytes Relative 05/25/2016 27  % Final  . Lymphs Abs 05/25/2016 4.2* 1.0 - 3.6 K/uL Final  . Monocytes Relative 05/25/2016 4  % Final  . Monocytes Absolute 05/25/2016 0.6  0.2 - 0.9 K/uL Final  . Eosinophils Relative 05/25/2016 1  % Final  . Eosinophils Absolute 05/25/2016 0.2  0 - 0.7 K/uL Final  . Basophils Relative 05/25/2016 1  % Final  . Basophils Absolute 05/25/2016 0.1  0 - 0.1 K/uL Final  . Sodium 05/25/2016 141  135 - 145 mmol/L Final  . Potassium 05/25/2016 3.6  3.5 - 5.1 mmol/L Final  . Chloride 05/25/2016 106  101 - 111 mmol/L Final  . CO2 05/25/2016 28  22 - 32 mmol/L Final  . Glucose, Bld 05/25/2016 165* 65 - 99 mg/dL Final  . BUN 05/25/2016 22* 6 - 20 mg/dL Final  . Creatinine, Ser 05/25/2016 0.63  0.44 - 1.00 mg/dL Final  . Calcium 05/25/2016 9.3  8.9 - 10.3 mg/dL Final  . Total Protein 05/25/2016 7.3  6.5 - 8.1 g/dL Final  . Albumin 05/25/2016 3.9  3.5 - 5.0 g/dL Final  . AST 05/25/2016 18  15 - 41 U/L Final  . ALT 05/25/2016 13* 14 - 54 U/L Final  . Alkaline Phosphatase 05/25/2016 81  38 - 126 U/L Final  . Total Bilirubin 05/25/2016 0.3  0.3 - 1.2  mg/dL Final  . GFR calc non Af Amer 05/25/2016 >60  >60 mL/min Final  . GFR calc Af Amer 05/25/2016 >60  >60 mL/min Final   Comment: (NOTE) The eGFR has been calculated using the CKD EPI equation. This calculation has not been validated in all clinical situations. eGFR's persistently <60 mL/min signify possible Chronic Kidney Disease.   . Anion gap 05/25/2016 7  5 - 15 Final  . Sed Rate 05/25/2016 9  0 - 30 mm/hr Final  . IgG (Immunoglobin G), Serum 05/25/2016 706  700 - 1,600 mg/dL Final  . IgA 05/25/2016 175  87 - 352 mg/dL Final  . IgM, Serum 05/25/2016 63  26 - 217 mg/dL Final  . Total Protein ELP 05/25/2016 6.5  6.0 - 8.5 g/dL Corrected  . Albumin SerPl Elph-Mcnc 05/25/2016 3.5  2.9 - 4.4 g/dL Corrected  . Alpha 1 05/25/2016 0.2  0.0 - 0.4 g/dL Corrected  . Alpha2 Glob SerPl Elph-Mcnc 05/25/2016 0.9  0.4 - 1.0 g/dL Corrected  . B-Globulin SerPl Elph-Mcnc 05/25/2016 1.0  0.7 - 1.3 g/dL Corrected  . Gamma Glob SerPl Elph-Mcnc 05/25/2016 0.8  0.4 - 1.8 g/dL Corrected  . M Protein SerPl Elph-Mcnc 05/25/2016 Not Observed  Not Observed g/dL Corrected  . Globulin, Total 05/25/2016 3.0  2.2 - 3.9 g/dL Corrected  . Albumin/Glob SerPl 05/25/2016 1.2  0.7 - 1.7 Corrected  . IFE 1 05/25/2016 Comment   Corrected  . Please Note 05/25/2016 Comment   Corrected   Comment: (NOTE) Protein electrophoresis scan will follow via computer, mail, or courier delivery. Performed At: [facility] [place], [place] [id number] Lindon Romp MD DX:[id number]   . Hep B Core Total Ab 05/25/2016 Negative  Negative Final   Comment: (NOTE) Performed At:  [facility] [place], [place] [id number] Lindon Romp MD OL:[id number]   . HCV Ab 05/25/2016 <0.1  0.0 - 0.9 s/co ratio Final   Comment: (NOTE)                                  Negative:     < 0.8                             Indeterminate: 0.8 - 0.9                                  Positive:     >  0.9 The CDC recommends that a positive HCV antibody result be followed up with a HCV Nucleic Acid Amplification test (010071). Performed At: [facility] [place], [place] [id number] Lindon Romp MD PQ:[id number]   . HIV Screen 4th Generation wRfx 05/25/2016 Non Reactive  Non Reactive Final   Comment: (NOTE) Performed At: [facility] [place], [place] [id number] Lindon Romp MD PJ:[id number]     Assessment:  Audriella Blakeley is a 56 y.o. female with apparent reactive leukocytosis.  She has a history of an elevated WBC count for 5-8 years.  WBC has ranged between 11,400 - 17,100.  She has a history of infections (UTI x 3 in past 6-8 months, pneumonia 3 years ago, chronic dental issues).  She smokes.  Her husband had hepatitis C.  Work-up on 05/25/2016 revealed a hematocrit of 41.7, hemoglobin 14.1, platelets 245,000, white count 15,600 with an [place] of 10,600. Differential included 67% segs, 27% lymphs, 4% monocytes, 1% eosinophils and 1% basophils.  Normal studies included:  ESR, SPEP, immunoglobulins, hepatitis B core antibody total, hepatitis C antibody, and HIV testing.  Last colonoscopy was on 05/21/2014.  She has had polyps.  She has not had a mammogram in 8 years.  Symptomatically, she has ongoing dental issues.  She is smoking 1 pack a day.  She continues to lose weight.  Plan: 1.  Review work-up.  Studies negative to date.  Sed rate not elevated despite dental infection. 2.  Follow-up pending BCR-ABL and JAK2. 3.  Follow-up with [facility] for low dose chest CT program. 4.  Encourage smoking cessation. 5.  Follow-up with dentistry for dental extraction(s). 6.  Re-review need for mammogram. 7.  Consider additional imaging if weight loss continued (after mammogram and low dose chest CT). 8.  RTC in 1 month for MD assessment and labs (CBC with diff, TSH).   Lequita Asal, MD  06/01/2016

## 2016-06-12 LAB — JAK2  V617F QUAL. WITH REFLEX TO EXON 12: Reflex:: 15

## 2016-06-12 LAB — JAK2 EXONS 12-15

## 2016-06-19 ENCOUNTER — Telehealth: Payer: Self-pay | Admitting: *Deleted

## 2016-06-19 DIAGNOSIS — Z87891 Personal history of nicotine dependence: Secondary | ICD-10-CM

## 2016-06-19 NOTE — Telephone Encounter (Signed)
Received referral for initial lung cancer screening scan. Contacted patient and obtained smoking history,(current, 44 pack year) as well as answering questions related to screening process. Patient denies signs of lung cancer such as weight loss or hemoptysis. Patient denies comorbidity that would prevent curative treatment if lung cancer were found. Patient is tentatively scheduled for shared decision making visit and CT scan on 07/05/16, pending insurance approval from business office.

## 2016-06-23 ENCOUNTER — Other Ambulatory Visit (INDEPENDENT_AMBULATORY_CARE_PROVIDER_SITE_OTHER): Payer: Self-pay | Admitting: Vascular Surgery

## 2016-06-23 DIAGNOSIS — M79605 Pain in left leg: Principal | ICD-10-CM

## 2016-06-23 DIAGNOSIS — M79604 Pain in right leg: Secondary | ICD-10-CM

## 2016-06-23 DIAGNOSIS — Z95828 Presence of other vascular implants and grafts: Secondary | ICD-10-CM

## 2016-06-26 ENCOUNTER — Encounter (INDEPENDENT_AMBULATORY_CARE_PROVIDER_SITE_OTHER): Payer: Medicaid Other

## 2016-06-26 ENCOUNTER — Ambulatory Visit (INDEPENDENT_AMBULATORY_CARE_PROVIDER_SITE_OTHER): Payer: Medicaid Other | Admitting: Vascular Surgery

## 2016-06-27 ENCOUNTER — Encounter (INDEPENDENT_AMBULATORY_CARE_PROVIDER_SITE_OTHER): Payer: Self-pay | Admitting: Vascular Surgery

## 2016-06-27 ENCOUNTER — Ambulatory Visit (INDEPENDENT_AMBULATORY_CARE_PROVIDER_SITE_OTHER): Payer: Medicaid Other | Admitting: Vascular Surgery

## 2016-06-27 ENCOUNTER — Ambulatory Visit (INDEPENDENT_AMBULATORY_CARE_PROVIDER_SITE_OTHER): Payer: Medicaid Other

## 2016-06-27 VITALS — BP 116/73 | HR 74 | Resp 16 | Ht 65.0 in | Wt 175.0 lb

## 2016-06-27 DIAGNOSIS — M79604 Pain in right leg: Secondary | ICD-10-CM | POA: Diagnosis not present

## 2016-06-27 DIAGNOSIS — Z720 Tobacco use: Secondary | ICD-10-CM

## 2016-06-27 DIAGNOSIS — Z95828 Presence of other vascular implants and grafts: Secondary | ICD-10-CM | POA: Diagnosis not present

## 2016-06-27 DIAGNOSIS — M79605 Pain in left leg: Secondary | ICD-10-CM

## 2016-06-27 DIAGNOSIS — R1084 Generalized abdominal pain: Secondary | ICD-10-CM | POA: Diagnosis not present

## 2016-06-27 NOTE — Progress Notes (Signed)
Subjective:    Patient ID: Autumn Faulkner, female    DOB: 1960/04/07, 56 y.o.   MRN: [id number] Chief Complaint  Patient presents with  . Re-evaluation    Ultrasound folow up   Patient presents to review vascular studies. She was last seen on 04/20/16. She continues to experiences bilateral lower extremity pain with and without activity. Her discomfort has progressed to the point she is unable to to function on a daily basis. She has a history of PAD with bilateral iliac stent placement. She is also experiencing abdominal pain. This pain seems to be constant and worsens with eating. The pain is associated with nausea.    Review of Systems  Constitutional: Positive for appetite change.  HENT: Negative.   Eyes: Negative.   Respiratory: Negative.   Cardiovascular:       Bilateral Lower Extremity Pain  Gastrointestinal: Positive for abdominal pain and nausea.  Endocrine: Negative.   Genitourinary: Negative.   Musculoskeletal: Negative.   Skin: Negative.   Allergic/Immunologic: Negative.   Neurological: Negative.   Hematological: Negative.   Psychiatric/Behavioral: Negative.       Objective:   Physical Exam  Constitutional: She is oriented to person, place, and time. She appears well-developed and well-nourished. No distress.  HENT:  Head: Normocephalic and atraumatic.  Eyes: Conjunctivae are normal. Pupils are equal, round, and reactive to light.  Neck: Normal range of motion.  Cardiovascular: Normal rate, regular rhythm, normal heart sounds and intact distal pulses.   Pulses:      Radial pulses are 2+ on the right side, and 2+ on the left side.       Dorsalis pedis pulses are 1+ on the right side, and 1+ on the left side.       Posterior tibial pulses are 1+ on the right side, and 1+ on the left side.  Pulmonary/Chest: Effort normal and breath sounds normal.  Abdominal: Soft. Bowel sounds are normal. She exhibits no distension. There is no tenderness. There is no rebound and no  guarding.  Musculoskeletal: Normal range of motion. She exhibits no edema.  Neurological: She is alert and oriented to person, place, and time.  Skin: Skin is warm and dry. She is not diaphoretic.  Psychiatric: She has a normal mood and affect. Her behavior is normal. Judgment and thought content normal.  Vitals reviewed.  BP 116/73 (BP Location: Right Arm)   Pulse 74   Resp 16   Ht 5\' 5"  (1.651 m)   Wt 175 lb (79.4 kg)   BMI 29.12 kg/m   Past Medical History:  Diagnosis Date  . Chronic back pain   . Chronic shoulder pain   . Diabetes mellitus   . High cholesterol   . High cholesterol   . Neuropathy   . Neuropathy   . Tachycardia    Social History   Social History  . Marital status: Legally Separated    Spouse name: N/A  . Number of children: N/A  . Years of education: N/A   Occupational History  . Not on file.   Social History Main Topics  . Smoking status: Current Every Day Smoker    Packs/day: 1.00    Types: Cigarettes  . Smokeless tobacco: Never Used  . Alcohol use Yes  . Drug use: No  . Sexual activity: Yes    Birth control/ protection: Surgical   Other Topics Concern  . Not on file   Social History Narrative  . No narrative on file  Past Surgical History:  Procedure Laterality Date  . ABDOMINAL HYSTERECTOMY    . BACK SURGERY    . GALLBLADDER SURGERY    . ILIAC ARTERY STENT    . SHOULDER SURGERY     Family History  Problem Relation Age of Onset  . Hypertension Mother   . Diabetes Mother    No Known Allergies     Assessment & Plan:  Patient presents to review vascular studies. She was last seen on 04/20/16. She continues to experiences bilateral lower extremity pain with and without activity. Her discomfort has progressed to the point she is unable to to function on a daily basis. She has a history of PAD with bilateral iliac stent placement. She is also experiencing abdominal pain. This pain seems to be constant and worsens with eating. The  pain is associated with nausea. The patient underwent an ABI today which was notable for normal ABI's with flattened waveforms in the foot. Aorto-Iliac with triphasic blood flow through stents.   1. Generalized abdominal pain - New Patient with generalized abdominal pain and nausea which is worse with eating.  Will order a mesenteric duplex to rule out any contributing PAD.  Patient has many risk factors (tobacco abuse, HLD, DM, HTN)  - VAS [place] MESENTERIC DUPLEX; Future  2. Lower extremity pain, bilateral - Stable Patient with history of PAD with bilateral iliac stent placement. ABI with flattened toe waves. Patient is symptomatic and unable to function on daily basis. RLE symptoms worse then left. Recommend RLE angiogram to assess anatomy and revascularize the leg if appropriate. Possibly assess LLE at same time if time / dye load allows. Will discuss this with Dr. Delana Meyer Will also bring patient back for venous duplex to assess venous anatomy and rule out reflux as this may also be contributing to the patients pain.   - VAS US RENAL ARTERY DUPLEX; Future  3. Tobacco abuse - Stable Patient is trying to quit. Still smokes about a pack a day. This is down from two packs a day. I have discussed (approximately 5 minutes) with the patient the role of tobacco in the pathogenesis of atherosclerosis and its effect on the progression of the disease, impact on the durability of interventions and its limitations on the formation of collateral pathways. I have recommended absolute tobacco cessation. I have discussed various options available for assistance with tobacco cessation including over the counter methods (Nicotine gum, patch and lozenges). We also discussed prescription options (Chantix, Nicotine Inhaler / Nasal Spray). The patient is not interested in pursuing any prescription tobacco cessation options at this time. The patient voices their understanding.   Current Outpatient Prescriptions on  File Prior to Visit  Medication Sig Dispense Refill  . ALPRAZolam (XANAX) 1 MG tablet Take 0.5-1 mg by mouth 2 (two) times daily as needed for sleep or anxiety (Always takes 1 mg in the evening. May take 0.5 mg during the day .).     Marland Kitchen aspirin EC 81 MG tablet Take 81 mg by mouth at bedtime.    Marland Kitchen atenolol (TENORMIN) 50 MG tablet Take 50 mg by mouth daily.    Marland Kitchen atorvastatin (LIPITOR) 40 MG tablet Take 40 mg by mouth daily.    . cephALEXin (KEFLEX) 500 MG capsule Take 500 mg by mouth 3 (three) times daily.    . cloNIDine-chlorthalidone (CLORPRES) 0.1-15 MG tablet Take 1 tablet by mouth 2 (two) times daily.    . clopidogrel (PLAVIX) 75 MG tablet Take 75 mg by mouth  daily.    . Cyanocobalamin (VITAMIN B-12) 1000 MCG SUBL Place 1,000 mcg under the tongue daily.    Marland Kitchen gabapentin (NEURONTIN) 800 MG tablet Take 800-1,600 mg by mouth 3 (three) times daily. Takes 800 mg twice a day and 1600 in the evening.    . insulin glargine (LANTUS) 100 UNIT/ML injection Inject 60 Units into the skin at bedtime.    . metFORMIN (GLUCOPHAGE) 500 MG tablet Take 500 mg by mouth 3 (three) times daily.     . ondansetron (ZOFRAN) 8 MG tablet Take 8 mg by mouth every 8 (eight) hours as needed for nausea.    Marland Kitchen oxyCODONE-acetaminophen (PERCOCET) 10-650 MG tablet Take 1 tablet by mouth every 6 (six) hours as needed for pain.     No current facility-administered medications on file prior to visit.     There are no Patient Instructions on file for this visit. No Follow-up on file.   Forbes Loll A Iyanah Demont, PA-C

## 2016-06-28 ENCOUNTER — Encounter (INDEPENDENT_AMBULATORY_CARE_PROVIDER_SITE_OTHER): Payer: Medicaid Other

## 2016-06-28 ENCOUNTER — Ambulatory Visit (INDEPENDENT_AMBULATORY_CARE_PROVIDER_SITE_OTHER): Payer: Medicaid Other | Admitting: Vascular Surgery

## 2016-06-29 ENCOUNTER — Telehealth (INDEPENDENT_AMBULATORY_CARE_PROVIDER_SITE_OTHER): Payer: Self-pay | Admitting: Vascular Surgery

## 2016-06-29 ENCOUNTER — Other Ambulatory Visit (INDEPENDENT_AMBULATORY_CARE_PROVIDER_SITE_OTHER): Payer: Self-pay | Admitting: Vascular Surgery

## 2016-06-29 DIAGNOSIS — M79604 Pain in right leg: Secondary | ICD-10-CM

## 2016-06-29 DIAGNOSIS — M79605 Pain in left leg: Principal | ICD-10-CM

## 2016-06-29 NOTE — Telephone Encounter (Signed)
Left message about speaking with Dr. Delana Meyer. Patient does not need an angiogram - ABI and ultrasound without PAD. Will order an MRI as pain is most likely from spine.

## 2016-07-03 ENCOUNTER — Encounter: Payer: Self-pay | Admitting: Hematology and Oncology

## 2016-07-03 ENCOUNTER — Inpatient Hospital Stay: Payer: Medicaid Other

## 2016-07-03 ENCOUNTER — Inpatient Hospital Stay: Payer: Medicaid Other | Attending: Hematology and Oncology | Admitting: Hematology and Oncology

## 2016-07-03 VITALS — BP 116/73 | HR 72 | Temp 96.7°F | Resp 18 | Wt 176.5 lb

## 2016-07-03 DIAGNOSIS — K0889 Other specified disorders of teeth and supporting structures: Secondary | ICD-10-CM | POA: Diagnosis not present

## 2016-07-03 DIAGNOSIS — Z79899 Other long term (current) drug therapy: Secondary | ICD-10-CM | POA: Diagnosis not present

## 2016-07-03 DIAGNOSIS — M25519 Pain in unspecified shoulder: Secondary | ICD-10-CM | POA: Insufficient documentation

## 2016-07-03 DIAGNOSIS — G8929 Other chronic pain: Secondary | ICD-10-CM | POA: Insufficient documentation

## 2016-07-03 DIAGNOSIS — Z794 Long term (current) use of insulin: Secondary | ICD-10-CM | POA: Diagnosis not present

## 2016-07-03 DIAGNOSIS — E119 Type 2 diabetes mellitus without complications: Secondary | ICD-10-CM | POA: Diagnosis not present

## 2016-07-03 DIAGNOSIS — M549 Dorsalgia, unspecified: Secondary | ICD-10-CM | POA: Insufficient documentation

## 2016-07-03 DIAGNOSIS — R634 Abnormal weight loss: Secondary | ICD-10-CM

## 2016-07-03 DIAGNOSIS — Z86010 Personal history of colonic polyps: Secondary | ICD-10-CM | POA: Diagnosis not present

## 2016-07-03 DIAGNOSIS — E7800 Pure hypercholesterolemia, unspecified: Secondary | ICD-10-CM | POA: Diagnosis not present

## 2016-07-03 DIAGNOSIS — G629 Polyneuropathy, unspecified: Secondary | ICD-10-CM | POA: Diagnosis not present

## 2016-07-03 DIAGNOSIS — Z7982 Long term (current) use of aspirin: Secondary | ICD-10-CM | POA: Insufficient documentation

## 2016-07-03 DIAGNOSIS — Z8701 Personal history of pneumonia (recurrent): Secondary | ICD-10-CM | POA: Insufficient documentation

## 2016-07-03 DIAGNOSIS — Z87440 Personal history of urinary (tract) infections: Secondary | ICD-10-CM | POA: Diagnosis not present

## 2016-07-03 DIAGNOSIS — F17210 Nicotine dependence, cigarettes, uncomplicated: Secondary | ICD-10-CM | POA: Insufficient documentation

## 2016-07-03 DIAGNOSIS — D72829 Elevated white blood cell count, unspecified: Secondary | ICD-10-CM | POA: Insufficient documentation

## 2016-07-03 LAB — CBC WITH DIFFERENTIAL/PLATELET
Basophils Absolute: 0 10*3/uL (ref 0–0.1)
Basophils Relative: 0 %
Eosinophils Absolute: 0.2 10*3/uL (ref 0–0.7)
Eosinophils Relative: 2 %
HCT: 46.2 % (ref 35.0–47.0)
Hemoglobin: 15.4 g/dL (ref 12.0–16.0)
Lymphocytes Relative: 36 %
Lymphs Abs: 4.4 10*3/uL — ABNORMAL HIGH (ref 1.0–3.6)
MCH: 30.2 pg (ref 26.0–34.0)
MCHC: 33.3 g/dL (ref 32.0–36.0)
MCV: 90.8 fL (ref 80.0–100.0)
Monocytes Absolute: 0.6 10*3/uL (ref 0.2–0.9)
Monocytes Relative: 5 %
Neutro Abs: 7 10*3/uL — ABNORMAL HIGH (ref 1.4–6.5)
Neutrophils Relative %: 57 %
Platelets: 236 10*3/uL (ref 150–440)
RBC: 5.08 MIL/uL (ref 3.80–5.20)
RDW: 13.9 % (ref 11.5–14.5)
WBC: 12.2 10*3/uL — ABNORMAL HIGH (ref 3.6–11.0)

## 2016-07-03 LAB — T4, FREE: Free T4: 0.87 ng/dL (ref 0.61–1.12)

## 2016-07-03 LAB — TSH: TSH: 3.845 u[IU]/mL (ref 0.350–4.500)

## 2016-07-03 NOTE — Progress Notes (Signed)
Patient offers no complaints today.  She has been changed from Klonopin to Valium 5 mg.

## 2016-07-03 NOTE — Progress Notes (Signed)
[facility] day:  07/03/2016  Chief Complaint: Autumn Faulkner is a 56 y.o. female with leukocytosis who is seen for 1 month assessment.  HPI:  The patient was last seen in the hematology clinic for initial consultation on 06/01/2016.  At that time, she had ongoing dental issues.  Work-up was negative.  BCR-ABL and JAK2 testing returned later and were also negative.  We discussed concern regarding her weight loss.  She was smoking.  She was scheduled to follow-up for the low dose chest CT program.  She has an appointment for a low dose chest CT on 07/05/2016.  Symptomatically she is "been better". She describes laying in bed for the past 7 weeks, depressed. She is smoking 1/2-3/4 of pack a day. She sits up at night and smokes.  She completed antibiotics for her dental issue.    Past Medical History:  Diagnosis Date  . Chronic back pain   . Chronic shoulder pain   . Diabetes mellitus   . High cholesterol   . High cholesterol   . Neuropathy   . Neuropathy   . Tachycardia     Past Surgical History:  Procedure Laterality Date  . ABDOMINAL HYSTERECTOMY    . BACK SURGERY    . GALLBLADDER SURGERY    . ILIAC ARTERY STENT    . SHOULDER SURGERY      Family History  Problem Relation Age of Onset  . Hypertension Mother   . Diabetes Mother     Social History:  reports that she has been smoking Cigarettes.  She has been smoking about 1.00 pack per day. She has never used smokeless tobacco. She reports that she drinks alcohol. She reports that she does not use drugs.  She began smoking at age 18.  She previously smoked 3 packs/day.  She is currently smoking 1 pack/day.  She denies any exposure to radiation or toxins.  She recently returned from a 3 week vacation in [place].  The patient is alone today.  Allergies: No Known Allergies  Current Medications: Current Outpatient Prescriptions  Medication Sig Dispense Refill  . albuterol (PROVENTIL  HFA;VENTOLIN HFA) 108 (90 Base) MCG/ACT inhaler Inhale into the lungs.    . ALPRAZolam (XANAX) 1 MG tablet Take 0.5-1 mg by mouth 2 (two) times daily as needed for sleep or anxiety (Always takes 1 mg in the evening. May take 0.5 mg during the day .).     Marland Kitchen aspirin EC 81 MG tablet Take 81 mg by mouth at bedtime.    Marland Kitchen atenolol (TENORMIN) 50 MG tablet Take 50 mg by mouth daily.    Marland Kitchen atorvastatin (LIPITOR) 40 MG tablet Take 40 mg by mouth daily.    . cephALEXin (KEFLEX) 500 MG capsule Take 500 mg by mouth 3 (three) times daily.    . cloNIDine-chlorthalidone (CLORPRES) 0.1-15 MG tablet Take 1 tablet by mouth 2 (two) times daily.    . clopidogrel (PLAVIX) 75 MG tablet Take 75 mg by mouth daily.    . Cyanocobalamin (VITAMIN B-12) 1000 MCG SUBL Place 1,000 mcg under the tongue daily.    . diazepam (VALIUM) 5 MG tablet Take 5 mg by mouth every 6 (six) hours as needed for anxiety.    . gabapentin (NEURONTIN) 800 MG tablet Take 800-1,600 mg by mouth 3 (three) times daily. Takes 800 mg twice a day and 1600 in the evening.    Marland Kitchen glimepiride (AMARYL) 2 MG tablet Take by mouth.    Marland Kitchen  insulin glargine (LANTUS) 100 UNIT/ML injection Inject 60 Units into the skin at bedtime.    . metFORMIN (GLUCOPHAGE) 500 MG tablet Take 500 mg by mouth 3 (three) times daily.     . ondansetron (ZOFRAN) 8 MG tablet Take 8 mg by mouth every 8 (eight) hours as needed for nausea.    Marland Kitchen oxyCODONE-acetaminophen (PERCOCET) 10-650 MG tablet Take 1 tablet by mouth every 6 (six) hours as needed for pain.     No current facility-administered medications for this visit.     Review of Systems:  GENERAL:  Fatigue.  Fever come and go.  No sweats.  Weight loss of 4 pounds since last visit (38 pounds in 8 months). PERFORMANCE STATUS (ECOG):  1 HEENT:  Dental issues.  Top left tooth pulled.  Nasal congestion.  Poor vision secondary to diabetes. Lungs: Shortness of breath at times.  Chronic cough.  No hemoptysis. Cardiac:  No chest pain,  palpitations, orthopnea, or PND. GI:  Stools sometimes dark.  No nausea, vomiting, constipation, diarrhea, melena or hematochezia.  Polyps.  Last colonoscopy 05/2014. GU:  Chronic urgency/full feeling.  No frequency, dysuria, or hematuria. Breasts:  No breast concerns.  Last mammogram 8 years ago. Musculoskeletal:  Back fusion.  No joint pain.  No muscle tenderness. Extremities:  Tender legs s/p stents.  Noswelling. Skin:  Scabs on shoulders.  No rashes. Neuro:  No headache, numbness or weakness, balance or coordination issues. Endocrine:  Diabetes, poorly controlled.  No thyroid issues, hot flashes or night sweats. Psych:  No mood changes.  Depression.  No anxiety. Pain:  Left sided tooth/jaw ache.  Left ear pain. Review of systems:  All other systems reviewed and found to be negative.  Physical Exam: Blood pressure 116/73, pulse 72, temperature (!) 96.7 F (35.9 C), temperature source Tympanic, resp. rate 18, weight 176 lb 8 oz (80.1 kg). GENERAL:  Well developed, well nourished, woman sitting comfortably in the exam room in no acute distress. MENTAL STATUS:  Alert and oriented to person, place and time. HEAD: Long blonde hair.  Normocephalic, atraumatic, face symmetric, no Cushingoid features. EYES:  Blue eyes.  Pupils equal round and reactive to light and accomodation.  No conjunctivitis or scleral icterus. ENT: No oral lesions.  Tongue normal. Mucous membranes moist.  RESPIRATORY:  Clear to auscultation without rales, wheezes or rhonchi. CARDIOVASCULAR:  Regular rate and rhythm without murmur, rub or gallop. ABDOMEN:  Soft, non-tender, with active bowel sounds, and no appreciable hepatosplenomegaly.  No masses. SKIN:  No rashes, ulcers or lesions. EXTREMITIES: No edema, no skin discoloration or tenderness.  No palpable cords. LYMPH NODES: No palpable cervical, supraclavicular, axillary or inguinal adenopathy  NEUROLOGICAL: Unremarkable. PSYCH:  Appropriate.   No visits with  results within 3 Day(s) from this visit.  Latest known visit with results is:  Office Visit on 05/25/2016  Component Date Value Ref Range Status  . WBC 05/25/2016 15.6* 3.6 - 11.0 K/uL Final  . RBC 05/25/2016 4.62  3.80 - 5.20 MIL/uL Final  . Hemoglobin 05/25/2016 14.1  12.0 - 16.0 g/dL Final  . HCT 05/25/2016 41.7  35.0 - 47.0 % Final  . MCV 05/25/2016 90.2  80.0 - 100.0 fL Final  . MCH 05/25/2016 30.5  26.0 - 34.0 pg Final  . MCHC 05/25/2016 33.8  32.0 - 36.0 g/dL Final  . RDW 05/25/2016 14.5  11.5 - 14.5 % Final  . Platelets 05/25/2016 245  150 - 440 K/uL Final  . Neutrophils Relative % 05/25/2016 67  %  Final  . Neutro Abs 05/25/2016 10.6* 1.4 - 6.5 K/uL Final  . Lymphocytes Relative 05/25/2016 27  % Final  . Lymphs Abs 05/25/2016 4.2* 1.0 - 3.6 K/uL Final  . Monocytes Relative 05/25/2016 4  % Final  . Monocytes Absolute 05/25/2016 0.6  0.2 - 0.9 K/uL Final  . Eosinophils Relative 05/25/2016 1  % Final  . Eosinophils Absolute 05/25/2016 0.2  0 - 0.7 K/uL Final  . Basophils Relative 05/25/2016 1  % Final  . Basophils Absolute 05/25/2016 0.1  0 - 0.1 K/uL Final  . Sodium 05/25/2016 141  135 - 145 mmol/L Final  . Potassium 05/25/2016 3.6  3.5 - 5.1 mmol/L Final  . Chloride 05/25/2016 106  101 - 111 mmol/L Final  . CO2 05/25/2016 28  22 - 32 mmol/L Final  . Glucose, Bld 05/25/2016 165* 65 - 99 mg/dL Final  . BUN 07/21/209 22* 6 - 20 mg/dL Final  . Creatinine, Ser 05/25/2016 0.63  0.44 - 1.00 mg/dL Final  . Calcium 17/35/6701 9.3  8.9 - 10.3 mg/dL Final  . Total Protein 05/25/2016 7.3  6.5 - 8.1 g/dL Final  . Albumin 41/05/129 3.9  3.5 - 5.0 g/dL Final  . AST 43/88/8757 18  15 - 41 U/L Final  . ALT 05/25/2016 13* 14 - 54 U/L Final  . Alkaline Phosphatase 05/25/2016 81  38 - 126 U/L Final  . Total Bilirubin 05/25/2016 0.3  0.3 - 1.2 mg/dL Final  . GFR calc non Af Amer 05/25/2016 >60  >60 mL/min Final  . GFR calc Af Amer 05/25/2016 >60  >60 mL/min Final   Comment: (NOTE) The  eGFR has been calculated using the CKD EPI equation. This calculation has not been validated in all clinical situations. eGFR's persistently <60 mL/min signify possible Chronic Kidney Disease.   . Anion gap 05/25/2016 7  5 - 15 Final  . Sed Rate 05/25/2016 9  0 - 30 mm/hr Final  . Specimen Type 05/25/2016 BLOOD   Final  . Cells Counted 05/25/2016 200   Final  . Cells Analyzed 05/25/2016 200   Final  . FISH Result 05/25/2016 Comment:   Final  . Interpretation 05/25/2016 Comment:   Final   Comment: (NOTE)             nuc ish 9q34(ASS1,ABL1)x2,22q11.2(BCRx2)[200].      The fluorescence in situ hybridization (FISH) study was normal. FISH, using unique sequence DNA probes for the ABL1 and BCR gene regions showed two ABL1 signals (red), two control ASS1 gene signals (aqua) located adjacent to the ABL1 locus at 9q34, and two BCR signals (green) at 22q11.2 in all interphase nuclei examined. There was NO evidence of CML or ALL-associated BCR/ABL1 dual fusion signals in this analysis. .      This analysis is limited to abnormalities detectable by the specific probes included in the study. FISH results should be interpreted within the context of a full cytogenetic analysis and pathology evaluation. Marland Kitchen  .This test was developed and its performance characteristics determined by [facility] of [address] [employer]). It has not been cleared or approved by the U.S. Food and Drug Administration. The DNA probe vendor for this study was [employer], community)                          .   . Director Review: 05/25/2016 Comment:   Final   Comment: (NOTE) M. Laury Axon, PhD, [facility] Performed At: [facility]  [place], [place] [id number] Nechama Guard MD [id number]   . JAK2 GenotypR 05/25/2016 Comment   Final   Comment: (NOTE) Result: NEGATIVE for the JAK2 V617F mutation. Interpretation:  The G to T nucleotide change encoding the  V617F mutation was not detected.  This result does not rule out the presence of the JAK2 mutation at a level below the sensitivity of detection of this assay, or the presence of other mutations within JAK2 not detected by this assay.  This result does not rule out a diagnosis of polycythemia vera, essential thrombocythemia or idiopathic myelofibrosis as the V617F mutation is not detected in all patients with these disorders.   Marland Kitchen BACKGROUND: 05/25/2016 Comment   Final   Comment: (NOTE) JAK2 is a cytoplasmic tyrosine kinase with a key role in signal transduction from multiple hematopoietic growth factor receptors. A point mutation within exon 14 of the JAK2 gene ([id number]) encoding a valine to phenylalanine substitution at position 617 of the JAK2 protein (V617F) has been identified in most patients with polycythemia vera, and in about half of those with either essential thrombocythemia or idiopathic myelofibrosis. The V617F has also been detected, although infrequently, in other myeloid disorders such as chronic myelomonocytic leukemia and chronic neutrophilic luekemia. V617F is an acquired mutation that alters a highly conserved valine present in the negative regulatory JH2 domain of the JAK2 protein and is predicted to dysregulate kinase activity. Methodology: Total genomic DNA was extracted and subjected to TaqMan real-time PCR amplification/detection. Two amplification products per sample were monitored by real-time PCR using primers/probes s                          pecific to JAK2 wild type (WT) and JAK2 mutant V617F. The ABI7900 Absolute Quantitation software will compare the patient specimen valuse to the standard curves and generate percent values for wild type and mutant type. In vitro studies have indicated that this assay has an analytical sensitivity of 1%. References: Baxter EJ, Scott Phineas Real, et al. Acquired mutation of the tyrosine kinase JAK2 in human  myeloproliferative disorders. Lancet. 2005 Mar 19-25; 365(9464):1054-1061. Alfonso Ramus Couedic JP. A unique clonal JAK2 mutation leading to constitutive signaling causes polycythaemia vera. Nature. 2005 Apr 28; 434(7037):1144-1148. Kralovics R, Passamonti F, Buser AS, et al. A gain-of-function mutation of JAK2 in myeloproliferative disorders. N Engl J Med. 2005 Apr 28; 352(17):1779-1790.   . Director Review, JAK2 05/25/2016 Comment   Final   Comment: (NOTE) Constance Goltz, PhD, [facility]               Director, [place] [facility] [place] and [place], [place]   . Reflex: 05/25/2016 15 Mutation Analysis is indicated.   Corrected   Comment: Comment Reflex to JAK2 Exon 12   . Extraction 05/25/2016 Completed   Corrected   Comment: (NOTE) Performed At: [facility] [address] [place], [place] [id number] Nechama Guard MD TD:[id number] Performed At: [facility] [place], [place] [id number] Nechama Guard MD DV:[id number]   . IgG (Immunoglobin G), Serum  05/25/2016 706  700 - 1,600 mg/dL Final  . IgA 05/25/2016 175  87 - 352 mg/dL Final  . IgM, Serum 05/25/2016 63  26 - 217 mg/dL Final  . Total Protein ELP 05/25/2016 6.5  6.0 - 8.5 g/dL Corrected  . Albumin SerPl Elph-Mcnc 05/25/2016 3.5  2.9 - 4.4 g/dL Corrected  . Alpha 1 05/25/2016 0.2  0.0 - 0.4 g/dL Corrected  . Alpha2 Glob SerPl Elph-Mcnc 05/25/2016 0.9  0.4 - 1.0 g/dL Corrected  . B-Globulin SerPl Elph-Mcnc 05/25/2016 1.0  0.7 - 1.3 g/dL Corrected  . Gamma Glob SerPl Elph-Mcnc 05/25/2016 0.8  0.4 - 1.8 g/dL Corrected  . M Protein SerPl Elph-Mcnc 05/25/2016 Not Observed  Not Observed g/dL Corrected  . Globulin, Total 05/25/2016 3.0  2.2 - 3.9 g/dL Corrected  . Albumin/Glob SerPl 05/25/2016 1.2  0.7 - 1.7 Corrected  . IFE 1 05/25/2016 Comment   Corrected  . Please Note  05/25/2016 Comment   Corrected   Comment: (NOTE) Protein electrophoresis scan will follow via computer, mail, or courier delivery. Performed At: [facility] [place], [place] [id number] Lindon Romp MD DG:[id number]   . Hep B Core Total Ab 05/25/2016 Negative  Negative Final   Comment: (NOTE) Performed At: [facility] [place], [place] [id number] Lindon Romp MD YT:[id number]   . HCV Ab 05/25/2016 <0.1  0.0 - 0.9 s/co ratio Final   Comment: (NOTE)                                  Negative:     < 0.8                             Indeterminate: 0.8 - 0.9                                  Positive:     > 0.9 The CDC recommends that a positive HCV antibody result be followed up with a HCV Nucleic Acid Amplification test (557322). Performed At: [facility] [place], [place] [id number] Lindon Romp MD BJ:[id number]   . HIV Screen 4th Generation wRfx 05/25/2016 Non Reactive  Non Reactive Final   Comment: (NOTE) Performed At: [facility] [place], [place] [id number] Lindon Romp MD IR:[id number]   . JAK2 Exons 12-15 Mut Det PCR: 05/25/2016 Comment   Final   Comment: (NOTE) NEGATIVE JAK2 mutations were not detected in exons 12, 13, 14 and 15. This result does not rule out the presence of JAK2 mutation at a level below the detection sensitivity of this assay, the presence of other mutations outside the analyzed region of the JAK2 gene, or the presence of a myeloproliferative or other neoplasm. Result must be correlated with other clinical data for the most accurate diagnosis.   . Indications 05/25/2016 Comment:   Final  . Specimen Type 05/25/2016 Comment   Final  . BACKGROUND: 05/25/2016 Comment   Final   Comment: (NOTE) JAK2 V617F mutation is detected in patients with polycythemia vera (95%), essential thrombocythemia (50%) and primary myelofibrosis (50%). A small  percentage of JAK2 mutation positive patients (3.3%) contain other non-V617F mutations within exons 12 to 15. The detection of a JAK2 gene mutation aids in the specific diagnosis of a myeloproliferative  neoplasm, and help distinguish this clonal disease from a benign reactive process.   . Method 05/25/2016 Comment   Final   Comment: (NOTE) Total RNA was purified from the provided specimen. The JAK2 gene region covering exons 12 to 15 was subjected to reverse- transcription coupled PCR amplification, and bi-directional sequencing to identify sequence variations. This assay has a sensitivity to detect approximately 15% population of cells containing the JAK2 mutations in a background of non-mutant cells. This test was developed and its performance characteristics determined by [facility]. It has not been cleared or approved by the Food and Drug Administration.   . References 05/25/2016 Comment   Final   Comment: (NOTE) Algasham, N. et al. Detection of mutations in JAK2 exons 12-15 by Sanger sequencing. Int J Lab Hemato. 2015, 38:34-41. Joelene Millin al. Mutation profile of JAK2 transcripts in patients with chronic myeloproliferative neoplasias. J Mol Diagn. 2009, 11:49-53.   Marland Kitchen DIRECTOR REVIEW: 05/25/2016 Comment   Final   Comment: (NOTE) Katina Degree, MD, PhD Director, [place] for [facility] and [place],  [phone] Performed At: [place], [place] [id number] Nechama Guard MD FJ:[id number] Performed At: [facility] [address] [place], [place] [id number] Nechama Guard MD IW:[id number]     Assessment:  Autumn Faulkner is a 56 y.o. female with apparent reactive leukocytosis.  She has a history of an elevated WBC count for 5-8 years.  WBC has ranged between 11,400 - 17,100.  She has a history of infections (UTI x 3 in past 6-8 months, pneumonia 3 years ago, chronic  dental issues).  She smokes.  Her husband had hepatitis C.  Work-up on 05/25/2016 revealed a hematocrit of 41.7, hemoglobin 14.1, platelets 245,000, white count 15,600 with an Moody of 10,600. Differential included 67% segs, 27% lymphs, 4% monocytes, 1% eosinophils and 1% basophils.  Normal studies included:  ESR, SPEP, immunoglobulins, hepatitis B core antibody total, hepatitis C antibody, and HIV testing, BCR-ABL, and JAK2 (V617F and exon 12-15).  Last colonoscopy was on 05/21/2014.  She has had polyps.  She has not had a mammogram in 8 years.  Dental issues are resolving.  She is smoking 1/2 - 3/4 pack a day.  She continues to lose weight.  WBC is 12,200.  Plan: 1.  Labs today:  CBC with diff, TSH. 2.  Review follow-up labs (BCR-ABL and JAK2) from last visit. 3.  Low dose chest CT on 07/05/2016. 4.  Bilateral mammogram 5.  Encourage smoking cessation. 6.  RTC for MD assessment after mammogram and chest CT.   Lequita Asal, MD  07/03/2016, 4:17 PM

## 2016-07-04 ENCOUNTER — Encounter: Payer: Self-pay | Admitting: Oncology

## 2016-07-05 ENCOUNTER — Inpatient Hospital Stay (HOSPITAL_BASED_OUTPATIENT_CLINIC_OR_DEPARTMENT_OTHER): Payer: Medicaid Other | Admitting: Oncology

## 2016-07-05 ENCOUNTER — Ambulatory Visit
Admission: RE | Admit: 2016-07-05 | Discharge: 2016-07-05 | Disposition: A | Discharge status: Home / Self Care | Payer: Medicaid Other | Source: Ambulatory Visit | Attending: Oncology | Admitting: Oncology

## 2016-07-05 DIAGNOSIS — F17210 Nicotine dependence, cigarettes, uncomplicated: Secondary | ICD-10-CM | POA: Diagnosis not present

## 2016-07-05 DIAGNOSIS — I700 Atherosclerosis of aorta: Secondary | ICD-10-CM | POA: Diagnosis not present

## 2016-07-05 DIAGNOSIS — I2510 Atherosclerotic heart disease of native coronary artery without angina pectoris: Secondary | ICD-10-CM | POA: Insufficient documentation

## 2016-07-05 DIAGNOSIS — Z87891 Personal history of nicotine dependence: Secondary | ICD-10-CM | POA: Insufficient documentation

## 2016-07-05 DIAGNOSIS — Z122 Encounter for screening for malignant neoplasm of respiratory organs: Secondary | ICD-10-CM

## 2016-07-05 NOTE — Progress Notes (Signed)
Personal history of tobacco use, presenting hazards to health In accordance with CMS guidelines, patient has met eligibility criteria including age, absence of signs or symptoms of lung cancer.  Social History  Substance Use Topics  . Smoking status: Current Every Day Smoker    Packs/day: 1.00    Years: 44.00    Types: Cigarettes  . Smokeless tobacco: Never Used  . Alcohol use Yes     A shared decision-making session was conducted prior to the performance of CT scan. This includes one or more decision aids, includes benefits and harms of screening, follow-up diagnostic testing, over-diagnosis, false positive rate, and total radiation exposure.  Counseling on the importance of adherence to annual lung cancer LDCT screening, impact of co-morbidities, and ability or willingness to undergo diagnosis and treatment is imperative for compliance of the program.  Counseling on the importance of continued smoking cessation for former smokers; the importance of smoking cessation for current smokers, and information about tobacco cessation interventions have been given to patient including [place] and 1800 quit [place] programs.  Written order for lung cancer screening with LDCT has been given to the patient and any and all questions have been answered to the best of my abilities.   Yearly follow up will be coordinated by Burgess Estelle, Thoracic Navigator.   Lucendia Herrlich, NP  07/05/16 3:00 PM

## 2016-07-05 NOTE — Assessment & Plan Note (Signed)
In accordance with CMS guidelines, patient has met eligibility criteria including age, absence of signs or symptoms of lung cancer.  Social History  Substance Use Topics  . Smoking status: Current Every Day Smoker    Packs/day: 1.00    Years: 44.00    Types: Cigarettes  . Smokeless tobacco: Never Used  . Alcohol use Yes     A shared decision-making session was conducted prior to the performance of CT scan. This includes one or more decision aids, includes benefits and harms of screening, follow-up diagnostic testing, over-diagnosis, false positive rate, and total radiation exposure.  Counseling on the importance of adherence to annual lung cancer LDCT screening, impact of co-morbidities, and ability or willingness to undergo diagnosis and treatment is imperative for compliance of the program.  Counseling on the importance of continued smoking cessation for former smokers; the importance of smoking cessation for current smokers, and information about tobacco cessation interventions have been given to patient including [place] and [facility] quit [place] programs.  Written order for lung cancer screening with LDCT has been given to the patient and any and all questions have been answered to the best of my abilities.   Yearly follow up will be coordinated by Burgess Estelle, Thoracic Navigator.

## 2016-07-07 ENCOUNTER — Encounter: Payer: Self-pay | Admitting: *Deleted

## 2016-07-07 ENCOUNTER — Ambulatory Visit (INDEPENDENT_AMBULATORY_CARE_PROVIDER_SITE_OTHER): Payer: Medicaid Other

## 2016-07-07 DIAGNOSIS — R1084 Generalized abdominal pain: Secondary | ICD-10-CM

## 2016-07-31 ENCOUNTER — Inpatient Hospital Stay: Payer: Medicaid Other | Admitting: Hematology and Oncology

## 2016-08-10 ENCOUNTER — Inpatient Hospital Stay: Payer: Medicaid Other | Attending: Hematology and Oncology | Admitting: Hematology and Oncology

## 2016-08-10 ENCOUNTER — Telehealth: Payer: Self-pay | Admitting: *Deleted

## 2016-08-10 ENCOUNTER — Encounter: Payer: Self-pay | Admitting: Hematology and Oncology

## 2016-08-10 VITALS — BP 134/76 | HR 77 | Temp 97.9°F | Resp 18 | Wt 178.3 lb

## 2016-08-10 DIAGNOSIS — Z7984 Long term (current) use of oral hypoglycemic drugs: Secondary | ICD-10-CM | POA: Diagnosis not present

## 2016-08-10 DIAGNOSIS — I2510 Atherosclerotic heart disease of native coronary artery without angina pectoris: Secondary | ICD-10-CM | POA: Diagnosis not present

## 2016-08-10 DIAGNOSIS — E119 Type 2 diabetes mellitus without complications: Secondary | ICD-10-CM | POA: Diagnosis not present

## 2016-08-10 DIAGNOSIS — Z7982 Long term (current) use of aspirin: Secondary | ICD-10-CM

## 2016-08-10 DIAGNOSIS — R000 Tachycardia, unspecified: Secondary | ICD-10-CM | POA: Diagnosis not present

## 2016-08-10 DIAGNOSIS — G8929 Other chronic pain: Secondary | ICD-10-CM | POA: Diagnosis not present

## 2016-08-10 DIAGNOSIS — M549 Dorsalgia, unspecified: Secondary | ICD-10-CM | POA: Insufficient documentation

## 2016-08-10 DIAGNOSIS — I700 Atherosclerosis of aorta: Secondary | ICD-10-CM | POA: Insufficient documentation

## 2016-08-10 DIAGNOSIS — G629 Polyneuropathy, unspecified: Secondary | ICD-10-CM | POA: Diagnosis not present

## 2016-08-10 DIAGNOSIS — E7800 Pure hypercholesterolemia, unspecified: Secondary | ICD-10-CM | POA: Diagnosis not present

## 2016-08-10 DIAGNOSIS — Z87442 Personal history of urinary calculi: Secondary | ICD-10-CM | POA: Insufficient documentation

## 2016-08-10 DIAGNOSIS — F17210 Nicotine dependence, cigarettes, uncomplicated: Secondary | ICD-10-CM | POA: Insufficient documentation

## 2016-08-10 DIAGNOSIS — R918 Other nonspecific abnormal finding of lung field: Secondary | ICD-10-CM | POA: Diagnosis not present

## 2016-08-10 DIAGNOSIS — Z794 Long term (current) use of insulin: Secondary | ICD-10-CM | POA: Insufficient documentation

## 2016-08-10 DIAGNOSIS — R110 Nausea: Secondary | ICD-10-CM | POA: Diagnosis not present

## 2016-08-10 DIAGNOSIS — D72829 Elevated white blood cell count, unspecified: Secondary | ICD-10-CM | POA: Diagnosis not present

## 2016-08-10 MED ORDER — ONDANSETRON HCL 8 MG PO TABS: 8.0000 mg | ORAL_TABLET | Freq: Three times a day (TID) | ORAL | 0 refills | Status: DC | PRN
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
## 2016-08-10 NOTE — Progress Notes (Signed)
[facility] day:  08/10/2016  Chief Complaint: Autumn Faulkner is a 56 y.o. female with leukocytosis who is seen for 1 month assessment and review of interval chest CT.  HPI:  The patient was last seen in the hematology clinic for initial consultation on 07/03/2016.  At that time, she had completed antibiotics for her dental issue.  She was smoking 1/2 - 3/4 pack a day.  She continued to lose weight.  WBC was 12,200.  TSH and free T4 were normal.  We discussed low dose chest CT secondary to her smoking history.  Low dose chest CT on 07/05/2016 revealed a 2.4 mm isolated right upper lobe pulmonary nodule and mild centrilobular emphysema.  Annual screening with low-dose chest CT without contrast was recommended in 12 months.  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  Symptomatically, she does not feel well. She has had some nausea for the past 3 days. She has gained 2 pounds. She is eating "a lot of chips".   Bowel movements are normal. Sleep is poor. She describes "lots of stress".  She describes a house guest bringing visitors to her house ("increased creep meter").   Past Medical History:  Diagnosis Date  . Chronic back pain   . Chronic shoulder pain   . Diabetes mellitus   . High cholesterol   . High cholesterol   . Neuropathy   . Neuropathy   . Tachycardia     Past Surgical History:  Procedure Laterality Date  . ABDOMINAL HYSTERECTOMY    . BACK SURGERY    . GALLBLADDER SURGERY    . ILIAC ARTERY STENT    . SHOULDER SURGERY      Family History  Problem Relation Age of Onset  . Hypertension Mother   . Diabetes Mother     Social History:  reports that she has been smoking Cigarettes.  She has a 44.00 pack-year smoking history. She has never used smokeless tobacco. She reports that she drinks alcohol. She reports that she does not use drugs.  She began smoking at age 2.  She previously smoked 3  packs/day.  She is currently smoking 1 pack/day.  She denies any exposure to radiation or toxins.  She recently returned from a 3 week vacation in [place].  The patient is alone today.  Allergies: No Known Allergies  Current Medications: Current Outpatient Prescriptions  Medication Sig Dispense Refill  . albuterol (PROVENTIL HFA;VENTOLIN HFA) 108 (90 Base) MCG/ACT inhaler Inhale into the lungs.    Marland Kitchen aspirin EC 81 MG tablet Take 81 mg by mouth at bedtime.    Marland Kitchen atenolol (TENORMIN) 50 MG tablet Take 50 mg by mouth daily.    Marland Kitchen atorvastatin (LIPITOR) 40 MG tablet Take 40 mg by mouth daily.    . Cyanocobalamin (VITAMIN B-12) 1000 MCG SUBL Place 1,000 mcg under the tongue daily.    . diazepam (VALIUM) 5 MG tablet Take 5 mg by mouth every 6 (six) hours as needed for anxiety.    . gabapentin (NEURONTIN) 800 MG tablet Take 800-1,600 mg by mouth 3 (three) times daily. Takes 800 mg twice a day and 1600 in the evening.    Marland Kitchen glimepiride (AMARYL) 2 MG tablet Take by mouth.    . insulin glargine (LANTUS) 100 UNIT/ML injection Inject 60 Units into the skin at bedtime.    . metFORMIN (GLUCOPHAGE) 500 MG tablet Take 500 mg by mouth 3 (three) times daily.     Marland Kitchen  oxyCODONE-acetaminophen (PERCOCET) 10-650 MG tablet Take 1 tablet by mouth every 6 (six) hours as needed for pain.    Marland Kitchen ALPRAZolam (XANAX) 1 MG tablet Take 0.5-1 mg by mouth 2 (two) times daily as needed for sleep or anxiety (Always takes 1 mg in the evening. May take 0.5 mg during the day .).     Marland Kitchen cephALEXin (KEFLEX) 500 MG capsule Take 500 mg by mouth 3 (three) times daily.    . cloNIDine-chlorthalidone (CLORPRES) 0.1-15 MG tablet Take 1 tablet by mouth 2 (two) times daily.    . clopidogrel (PLAVIX) 75 MG tablet Take 75 mg by mouth daily.    . ondansetron (ZOFRAN) 8 MG tablet Take 8 mg by mouth every 8 (eight) hours as needed for nausea.     No current facility-administered medications for this visit.     Review of Systems:  GENERAL:  Fatigue.  No  fever or sweats.  Weight up 2 pounds since last visit. PERFORMANCE STATUS (ECOG):  1 HEENT:  h/o dental issues.  Nasal congestion.  Poor vision secondary to diabetes. Lungs: Shortness of breath at times.  Chronic cough.  No hemoptysis. Cardiac:  No chest pain, palpitations, orthopnea, or PND. GI:  Stools sometimes dark.  Nausea.  No nausea, vomiting, constipation, diarrhea, melena or hematochezia.  Polyps.  Last colonoscopy 05/2014. GU:  Chronic urgency/full feeling.  No frequency, dysuria, or hematuria. Breasts:  No breast concerns.  Last mammogram 8 years ago. Musculoskeletal:  Back fusion.  Feet and hips hurt.  No muscle tenderness. Extremities:  Tender legs s/p stents.  Noswelling. Skin:  Scabs on shoulders.  No rashes. Neuro:  Dizzy.  Forgetful.  No headache, numbness or weakness, balance or coordination issues. Endocrine:  Diabetes, poorly controlled.  No thyroid issues, hot flashes or night sweats. Psych:  Lots of stress.  Poor sleep.   Pain:  Feet and hip pain (6 out of 10). Review of systems:  All other systems reviewed and found to be negative.  Physical Exam: Blood pressure 134/76, pulse 77, temperature 97.9 F (36.6 C), temperature source Tympanic, resp. rate 18, weight 178 lb 5 oz (80.9 kg). GENERAL:  Well developed, well nourished, woman sitting comfortably in the exam room in no acute distress. MENTAL STATUS:  Alert and oriented to person, place and time. HEAD: Long blonde hair with dark roots.  Normocephalic, atraumatic, face symmetric, no Cushingoid features. EYES:  Blue eyes.  Pupils equal round and reactive to light and accomodation.  No conjunctivitis or scleral icterus. ENT: No oral lesions.  Tongue normal. Mucous membranes moist.  RESPIRATORY:  Clear to auscultation without rales, wheezes or rhonchi. CARDIOVASCULAR:  Regular rate and rhythm without murmur, rub or gallop. ABDOMEN:  Soft, non-tender, with active bowel sounds, and no appreciable hepatosplenomegaly.  No  masses. SKIN:  No rashes, ulcers or lesions. EXTREMITIES: No edema, no skin discoloration or tenderness.  No palpable cords. LYMPH NODES: No palpable cervical, supraclavicular, axillary or inguinal adenopathy  NEUROLOGICAL: Unremarkable. PSYCH:  Appropriate.   No visits with results within 3 Day(s) from this visit.  Latest known visit with results is:  Appointment on 07/03/2016  Component Date Value Ref Range Status  . WBC 07/03/2016 12.2* 3.6 - 11.0 K/uL Final  . RBC 07/03/2016 5.08  3.80 - 5.20 MIL/uL Final  . Hemoglobin 07/03/2016 15.4  12.0 - 16.0 g/dL Final  . HCT 07/03/2016 46.2  35.0 - 47.0 % Final  . MCV 07/03/2016 90.8  80.0 - 100.0 fL Final  .  MCH 07/03/2016 30.2  26.0 - 34.0 pg Final  . MCHC 07/03/2016 33.3  32.0 - 36.0 g/dL Final  . RDW 07/03/2016 13.9  11.5 - 14.5 % Final  . Platelets 07/03/2016 236  150 - 440 K/uL Final  . Neutrophils Relative % 07/03/2016 57  % Final  . Neutro Abs 07/03/2016 7.0* 1.4 - 6.5 K/uL Final  . Lymphocytes Relative 07/03/2016 36  % Final  . Lymphs Abs 07/03/2016 4.4* 1.0 - 3.6 K/uL Final  . Monocytes Relative 07/03/2016 5  % Final  . Monocytes Absolute 07/03/2016 0.6  0.2 - 0.9 K/uL Final  . Eosinophils Relative 07/03/2016 2  % Final  . Eosinophils Absolute 07/03/2016 0.2  0 - 0.7 K/uL Final  . Basophils Relative 07/03/2016 0  % Final  . Basophils Absolute 07/03/2016 0.0  0 - 0.1 K/uL Final  . TSH 07/03/2016 3.845  0.350 - 4.500 uIU/mL Final   Performed by a 3rd Generation assay with a functional sensitivity of <=0.01 uIU/mL.  . Free T4 07/03/2016 0.87  0.61 - 1.12 ng/dL Final   Comment: (NOTE) Biotin ingestion may interfere with free T4 tests. If the results are inconsistent with the TSH level, previous test results, or the clinical presentation, then consider biotin interference. If needed, order repeat testing after stopping biotin.     Assessment:  Signa Reifsteck is a 56 y.o. female with apparent reactive leukocytosis.  She has a  history of an elevated WBC count for 5-8 years.  WBC has ranged between 11,400 - 17,100.  She has a history of infections (UTI x 3 in past 6-8 months, pneumonia 3 years ago, chronic dental issues).  She smokes.  Her husband had hepatitis C.  Work-up on 05/25/2016 revealed a hematocrit of 41.7, hemoglobin 14.1, platelets 245,000, white count 15,600 with an [place] of 10,600. Differential included 67% segs, 27% lymphs, 4% monocytes, 1% eosinophils and 1% basophils.  Normal studies included:  ESR, SPEP, immunoglobulins, hepatitis B core antibody total, hepatitis C antibody, and HIV testing, BCR-ABL, and JAK2 (V617F and exon 12-15).  Last colonoscopy was on 05/21/2014.  She has had polyps.  She has not had a mammogram in 8 years.  TSH was normal on 07/03/2016.  Dental issues have resolved.  She describes a lot of stress.  She is smoking 1/2 - 3/4 pack a day.  She has gained 2 pounds.  WBC is 12,200.  Plan: 1.  Discuss low dose chest CT with plan for follow-up imaging in 1 year. 2.  Encourage smoking cessation. 3.  Reschedule bilateral mammogram 4.  Adult protective services/social work to assist with social situation. 5.  RTC after mammogram.   Lequita Asal, MD  08/10/2016, 4:05 PM

## 2016-08-10 NOTE — Progress Notes (Signed)
Patient first told me she has been force eating to gain weight.  She has gained 2.5 pounds since last visit.  Then she told me she is nauseated and cannot eat.  She talks about being concerned about her weight loss but when told she had gained 2.5 pounds she said she has to back off because she doesn't want to gain too much.

## 2016-08-31 ENCOUNTER — Ambulatory Visit
Admission: RE | Admit: 2016-08-31 | Discharge: 2016-08-31 | Disposition: A | Discharge status: Home / Self Care | Payer: Medicaid Other | Source: Ambulatory Visit | Attending: Hematology and Oncology | Admitting: Hematology and Oncology

## 2016-08-31 ENCOUNTER — Inpatient Hospital Stay: Payer: Medicaid Other | Attending: Hematology and Oncology | Admitting: Hematology and Oncology

## 2016-08-31 ENCOUNTER — Encounter: Payer: Self-pay | Admitting: Hematology and Oncology

## 2016-08-31 VITALS — BP 93/57 | HR 69 | Temp 98.6°F | Resp 18 | Wt 179.1 lb

## 2016-08-31 DIAGNOSIS — E7800 Pure hypercholesterolemia, unspecified: Secondary | ICD-10-CM

## 2016-08-31 DIAGNOSIS — K5900 Constipation, unspecified: Secondary | ICD-10-CM

## 2016-08-31 DIAGNOSIS — F17210 Nicotine dependence, cigarettes, uncomplicated: Secondary | ICD-10-CM

## 2016-08-31 DIAGNOSIS — Z7982 Long term (current) use of aspirin: Secondary | ICD-10-CM

## 2016-08-31 DIAGNOSIS — Z79899 Other long term (current) drug therapy: Secondary | ICD-10-CM | POA: Diagnosis not present

## 2016-08-31 DIAGNOSIS — Z8701 Personal history of pneumonia (recurrent): Secondary | ICD-10-CM

## 2016-08-31 DIAGNOSIS — R634 Abnormal weight loss: Secondary | ICD-10-CM

## 2016-08-31 DIAGNOSIS — R000 Tachycardia, unspecified: Secondary | ICD-10-CM

## 2016-08-31 DIAGNOSIS — Z1231 Encounter for screening mammogram for malignant neoplasm of breast: Secondary | ICD-10-CM | POA: Diagnosis present

## 2016-08-31 DIAGNOSIS — Z87440 Personal history of urinary (tract) infections: Secondary | ICD-10-CM | POA: Diagnosis not present

## 2016-08-31 DIAGNOSIS — R918 Other nonspecific abnormal finding of lung field: Secondary | ICD-10-CM

## 2016-08-31 DIAGNOSIS — M545 Low back pain: Secondary | ICD-10-CM | POA: Diagnosis not present

## 2016-08-31 DIAGNOSIS — D72829 Elevated white blood cell count, unspecified: Secondary | ICD-10-CM

## 2016-08-31 DIAGNOSIS — E119 Type 2 diabetes mellitus without complications: Secondary | ICD-10-CM | POA: Diagnosis not present

## 2016-08-31 DIAGNOSIS — G629 Polyneuropathy, unspecified: Secondary | ICD-10-CM

## 2016-08-31 NOTE — Progress Notes (Signed)
[facility] day:  08/31/2016  Chief Complaint: Brookelin Felber is a 56 y.o. female with leukocytosis who is seen for 1 month assessment and review of interval mammogram.  HPI:  The patient was last seen in the hematology clinic on 08/10/2016.  At that time, low dose chest CT was reviewed.  She had a 2.4 mm isolated RUL pulmonary nodule.  Smoking cessation was encouraged.  Repeat imaging in 12 months was suggested.  Patient had bilateral mammogram today; results not available for review at the time of her office visit.   Symptomatically, patient has no physical complaints of anything today. Patient reporting that previous social/safety concerns have been addressed by APS/social services. Patient reports that she feels safe at home and the person that was causing concerns is being "removed from her home".    Past Medical History:  Diagnosis Date  . Chronic back pain   . Chronic shoulder pain   . Diabetes mellitus   . High cholesterol   . High cholesterol   . Neuropathy   . Neuropathy   . Tachycardia     Past Surgical History:  Procedure Laterality Date  . ABDOMINAL HYSTERECTOMY    . BACK SURGERY    . GALLBLADDER SURGERY    . ILIAC ARTERY STENT    . SHOULDER SURGERY      Family History  Problem Relation Age of Onset  . Hypertension Mother   . Diabetes Mother     Social History:  reports that she has been smoking Cigarettes.  She has a 44.00 pack-year smoking history. She has never used smokeless tobacco. She reports that she drinks alcohol. She reports that she does not use drugs.  She began smoking at age 60.  She previously smoked 3 packs/day.  She is currently smoking; has decreased to < 1/2 pack/day.  She denies any exposure to radiation or toxins.  The patient is alone today.  Allergies: No Known Allergies  Current Medications: Current Outpatient Prescriptions  Medication Sig Dispense Refill  . albuterol (PROVENTIL  HFA;VENTOLIN HFA) 108 (90 Base) MCG/ACT inhaler Inhale into the lungs.    . ALPRAZolam (XANAX) 1 MG tablet Take 0.5-1 mg by mouth 2 (two) times daily as needed for sleep or anxiety (Always takes 1 mg in the evening. May take 0.5 mg during the day .).     Marland Kitchen aspirin EC 81 MG tablet Take 81 mg by mouth at bedtime.    Marland Kitchen atenolol (TENORMIN) 50 MG tablet Take 50 mg by mouth daily.    Marland Kitchen atorvastatin (LIPITOR) 40 MG tablet Take 40 mg by mouth daily.    . cephALEXin (KEFLEX) 500 MG capsule Take 500 mg by mouth 3 (three) times daily.    . cloNIDine-chlorthalidone (CLORPRES) 0.1-15 MG tablet Take 1 tablet by mouth 2 (two) times daily.    . clopidogrel (PLAVIX) 75 MG tablet Take 75 mg by mouth daily.    . Cyanocobalamin (VITAMIN B-12) 1000 MCG SUBL Place 1,000 mcg under the tongue daily.    . diazepam (VALIUM) 5 MG tablet Take 5 mg by mouth every 6 (six) hours as needed for anxiety.    . gabapentin (NEURONTIN) 800 MG tablet Take 800-1,600 mg by mouth 3 (three) times daily. Takes 800 mg twice a day and 1600 in the evening.    Marland Kitchen glimepiride (AMARYL) 2 MG tablet Take by mouth.    . insulin glargine (LANTUS) 100 UNIT/ML injection Inject 60 Units into the skin  at bedtime.    . metFORMIN (GLUCOPHAGE) 500 MG tablet Take 500 mg by mouth 3 (three) times daily.     . ondansetron (ZOFRAN) 8 MG tablet Take 1 tablet (8 mg total) by mouth every 8 (eight) hours as needed for nausea. 10 tablet 0  . oxyCODONE-acetaminophen (PERCOCET) 10-650 MG tablet Take 1 tablet by mouth every 6 (six) hours as needed for pain.     No current facility-administered medications for this visit.     Review of Systems:  GENERAL:  Fatigue.  Fever come and go.  No sweats.  Weight loss of 1 pound since last visit (39 pounds in 8 months). PERFORMANCE STATUS (ECOG):  1 HEENT:  Dental issues.  Top left tooth pulled.  Nasal congestion.  Poor vision secondary to diabetes. Lungs: Shortness of breath at times.  Chronic cough.  No hemoptysis. Cardiac:   No chest pain, palpitations, orthopnea, or PND. GI:  Stools sometimes dark.  No nausea, vomiting, constipation, diarrhea, melena or hematochezia.  Polyps.  Last colonoscopy 05/2014. GU:  Chronic urgency/full feeling.  No frequency, dysuria, or hematuria. Breasts:  No breast concerns.  Last mammogram 8 years ago. Musculoskeletal:  Back fusion.  Feet and hips chronically hurt.  No muscle tenderness. Extremities:  Tender legs s/p stents.  No swelling. Skin:  Scabs on shoulders.  No rashes. Neuro:  Dizzy.  Forgetful.  No headache, numbness or weakness, balance or coordination issues. Endocrine:  Diabetes, poorly controlled.  No thyroid issues, hot flashes or night sweats. Psych:  Lots of stress.  Poor sleep.   Pain: No pain. Review of systems:  All other systems reviewed and found to be negative.  Physical Exam: Blood pressure (!) 93/57, pulse 69, temperature 98.6 F (37 C), temperature source Tympanic, resp. rate 18, weight 179 lb 2 oz (81.3 kg). GENERAL:  Well developed, well nourished, woman sitting comfortably in the exam room in no acute distress. MENTAL STATUS:  Alert and oriented to person, place and time. HEAD: Long blonde hair with dark roots.  Normocephalic, atraumatic, face symmetric, no Cushingoid features. EYES:  Blue eyes.  Pupils equal round and reactive to light and accomodation.  No conjunctivitis or scleral icterus. ENT: No oral lesions.  Tongue normal. Mucous membranes moist.  RESPIRATORY:  Clear to auscultation without rales, wheezes or rhonchi. CARDIOVASCULAR:  Regular rate and rhythm without murmur, rub or gallop. ABDOMEN:  Soft, non-tender, with active bowel sounds, and no appreciable hepatosplenomegaly.  No masses. SKIN:  No rashes, ulcers or lesions. EXTREMITIES: No edema, no skin discoloration or tenderness.  No palpable cords. LYMPH NODES: No palpable cervical, supraclavicular, axillary or inguinal adenopathy  NEUROLOGICAL: Unremarkable. PSYCH:   Appropriate.   No visits with results within 3 Day(s) from this visit.  Latest known visit with results is:  Appointment on 07/03/2016  Component Date Value Ref Range Status  . WBC 07/03/2016 12.2* 3.6 - 11.0 K/uL Final  . RBC 07/03/2016 5.08  3.80 - 5.20 MIL/uL Final  . Hemoglobin 07/03/2016 15.4  12.0 - 16.0 g/dL Final  . HCT 07/03/2016 46.2  35.0 - 47.0 % Final  . MCV 07/03/2016 90.8  80.0 - 100.0 fL Final  . MCH 07/03/2016 30.2  26.0 - 34.0 pg Final  . MCHC 07/03/2016 33.3  32.0 - 36.0 g/dL Final  . RDW 07/03/2016 13.9  11.5 - 14.5 % Final  . Platelets 07/03/2016 236  150 - 440 K/uL Final  . Neutrophils Relative % 07/03/2016 57  % Final  . Neutro  Abs 07/03/2016 7.0* 1.4 - 6.5 K/uL Final  . Lymphocytes Relative 07/03/2016 36  % Final  . Lymphs Abs 07/03/2016 4.4* 1.0 - 3.6 K/uL Final  . Monocytes Relative 07/03/2016 5  % Final  . Monocytes Absolute 07/03/2016 0.6  0.2 - 0.9 K/uL Final  . Eosinophils Relative 07/03/2016 2  % Final  . Eosinophils Absolute 07/03/2016 0.2  0 - 0.7 K/uL Final  . Basophils Relative 07/03/2016 0  % Final  . Basophils Absolute 07/03/2016 0.0  0 - 0.1 K/uL Final  . TSH 07/03/2016 3.845  0.350 - 4.500 uIU/mL Final   Performed by a 3rd Generation assay with a functional sensitivity of <=0.01 uIU/mL.  . Free T4 07/03/2016 0.87  0.61 - 1.12 ng/dL Final   Comment: (NOTE) Biotin ingestion may interfere with free T4 tests. If the results are inconsistent with the TSH level, previous test results, or the clinical presentation, then consider biotin interference. If needed, order repeat testing after stopping biotin.     Assessment:  Eliz Mccleave is a 56 y.o. female with apparent reactive leukocytosis.  She has a history of an elevated WBC count for 5-8 years.  WBC has ranged between 11,400 - 17,100.  She has a history of infections (UTI x 3 in past 6-8 months, pneumonia 3 years ago, chronic dental issues).  She smokes.  Her husband had hepatitis C.  Work-up  on 05/25/2016 revealed a hematocrit of 41.7, hemoglobin 14.1, platelets 245,000, white count 15,600 with an [place] of 10,600. Differential included 67% segs, 27% lymphs, 4% monocytes, 1% eosinophils and 1% basophils.  Normal studies included:  ESR, SPEP, immunoglobulins, hepatitis B core antibody total, hepatitis C antibody, and HIV testing, BCR-ABL, and JAK2 (V617F and exon 12-15).  Low dose chest CT on 07/05/2016 revealed a 2.4 mm isolated right upper lobe pulmonary nodule and mild centrilobular emphysema.  Annual screening with low-dose chest CT without contrast was recommended in 12 months.  Last colonoscopy was on 05/21/2014.  She had polyps.  She had a mammogram today (last 8 years ago).  TSH was normal on 07/03/2016.  Symptomatically, she notes issues with constipation.  WBC was 12,200 on 07/03/2016.  Plan: 1.  Review initial labs and work-up.  Leukocytosis is reactive.  Testing is negative and possibly related to smoking.  Encourage smoking cessation. 2.  Bilateral mammogram done today; report not available at time of visit. RN to call patient with the results. 3.  Suggest annual screening with low-dose chest CT without contrast in 12 months. 4.  RTC prn.   Lequita Asal, MD  08/31/2016, 4:28 PM

## 2016-08-31 NOTE — Progress Notes (Signed)
Patient states she went to [place] and ended up in the ED because of SOB and constipation.  States she was given medication that gave her diarrhea in the beginning but she has not had a BM in over a week now.  Patient presents today tearful.  She had a bad experience at the airport when she observed a dog getting his paw stuck in the moving sidewalk.  When she got home she got news that her friend is dying.  She states her home situation is better.  She does state that while boarding the plane she lit up and she was asked if she had been handling fertilizer.  She told them about her bowel problems and she was instructed to see her MD when she got home.

## 2016-09-01 ENCOUNTER — Other Ambulatory Visit: Payer: Self-pay | Admitting: *Deleted

## 2016-09-01 ENCOUNTER — Inpatient Hospital Stay
Admission: RE | Admit: 2016-09-01 | Discharge: 2016-09-01 | Disposition: A | Discharge status: Home / Self Care | Payer: Self-pay | Source: Ambulatory Visit | Attending: *Deleted | Admitting: *Deleted

## 2016-09-01 DIAGNOSIS — Z9289 Personal history of other medical treatment: Secondary | ICD-10-CM

## 2016-09-14 ENCOUNTER — Ambulatory Visit (INDEPENDENT_AMBULATORY_CARE_PROVIDER_SITE_OTHER): Payer: Medicaid Other | Admitting: Vascular Surgery

## 2016-09-14 ENCOUNTER — Encounter (INDEPENDENT_AMBULATORY_CARE_PROVIDER_SITE_OTHER): Payer: Medicaid Other

## 2016-09-20 ENCOUNTER — Ambulatory Visit
Admission: EM | Admit: 2016-09-20 | Discharge: 2016-09-20 | Disposition: A | Discharge status: Home / Self Care | Payer: Medicaid Other | Attending: Family Medicine | Admitting: Family Medicine

## 2016-09-20 DIAGNOSIS — K0889 Other specified disorders of teeth and supporting structures: Secondary | ICD-10-CM | POA: Diagnosis not present

## 2016-09-20 MED ORDER — LIDOCAINE VISCOUS 2 % MT SOLN: 15.0000 mL | Freq: Three times a day (TID) | OROMUCOSAL | 0 refills | Status: DC | PRN

## 2016-09-20 MED ORDER — AMOXICILLIN-POT CLAVULANATE 875-125 MG PO TABS: 1.0000 | ORAL_TABLET | Freq: Two times a day (BID) | ORAL | 0 refills | Status: DC
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
## 2016-09-20 NOTE — ED Triage Notes (Signed)
56 year old Caucasian female is here today with complaints of tooth pain on her top right side since this past Sunday. She states her dental office rescheduled her appointment due to her being late and advised her to come to the Urgent Care.

## 2016-09-20 NOTE — Discharge Instructions (Signed)
Take medication as prescribed. Rest. Drink plenty of fluids.   Follow up with your primary care physician this week as needed. Follow up with your dentist. Return to Urgent care for new or worsening concerns.

## 2016-10-04 ENCOUNTER — Other Ambulatory Visit (INDEPENDENT_AMBULATORY_CARE_PROVIDER_SITE_OTHER): Payer: Self-pay | Admitting: Vascular Surgery

## 2016-10-04 DIAGNOSIS — M79604 Pain in right leg: Secondary | ICD-10-CM

## 2016-10-04 DIAGNOSIS — M7989 Other specified soft tissue disorders: Principal | ICD-10-CM

## 2016-10-05 ENCOUNTER — Ambulatory Visit (INDEPENDENT_AMBULATORY_CARE_PROVIDER_SITE_OTHER): Payer: Medicaid Other

## 2016-10-05 ENCOUNTER — Ambulatory Visit (INDEPENDENT_AMBULATORY_CARE_PROVIDER_SITE_OTHER): Payer: Medicaid Other | Admitting: Vascular Surgery

## 2016-10-05 ENCOUNTER — Other Ambulatory Visit (INDEPENDENT_AMBULATORY_CARE_PROVIDER_SITE_OTHER): Payer: Self-pay | Admitting: Vascular Surgery

## 2016-10-05 VITALS — BP 121/63 | HR 70 | Resp 16 | Ht 65.0 in | Wt 175.0 lb

## 2016-10-05 DIAGNOSIS — M79605 Pain in left leg: Principal | ICD-10-CM

## 2016-10-05 DIAGNOSIS — J441 Chronic obstructive pulmonary disease with (acute) exacerbation: Secondary | ICD-10-CM | POA: Diagnosis not present

## 2016-10-05 DIAGNOSIS — E118 Type 2 diabetes mellitus with unspecified complications: Secondary | ICD-10-CM | POA: Diagnosis not present

## 2016-10-05 DIAGNOSIS — M79604 Pain in right leg: Secondary | ICD-10-CM

## 2016-10-05 DIAGNOSIS — M79606 Pain in leg, unspecified: Secondary | ICD-10-CM | POA: Insufficient documentation

## 2016-10-05 DIAGNOSIS — I10 Essential (primary) hypertension: Secondary | ICD-10-CM

## 2016-10-05 DIAGNOSIS — I779 Disorder of arteries and arterioles, unspecified: Secondary | ICD-10-CM | POA: Diagnosis not present

## 2016-10-05 DIAGNOSIS — Z794 Long term (current) use of insulin: Secondary | ICD-10-CM

## 2016-10-05 DIAGNOSIS — G894 Chronic pain syndrome: Secondary | ICD-10-CM | POA: Diagnosis not present

## 2016-10-05 NOTE — Progress Notes (Signed)
MRN : [id number]  Autumn Faulkner is a 56 y.o. (1960/12/14) female who presents with chief complaint of  Chief Complaint  Patient presents with  . Re-evaluation    Ultrasound follow up  .  History of Present Illness: The patient is seen for follow up of painful lower extremities. Patient notes the pain is variable and not always associated with activity.  The pain is somewhat consistent day to day occurring on most days. The patient notes the pain also occurs with standing and routinely seems worse as the day wears on. The pain has been progressive over the past several years. The patient states these symptoms are causing  a profound negative impact on quality of life and daily activities.  The patient denies rest pain or dangling of an extremity off the side of the bed during the night for relief. No open wounds or sores at this time. No history of DVT or phlebitis. No prior interventions or surgeries.  There is a  history of back problems and DJD of the lumbar and sacral spine.   Venous duplex is negative for DVT or reflux disease  No outpatient prescriptions have been marked as taking for the 10/05/16 encounter (Office Visit) with Delana Meyer, Dolores Lory, MD.    Past Medical History:  Diagnosis Date  . Chronic back pain   . Chronic shoulder pain   . Diabetes mellitus   . High cholesterol   . High cholesterol   . Neuropathy   . Neuropathy   . Tachycardia     Past Surgical History:  Procedure Laterality Date  . ABDOMINAL HYSTERECTOMY    . BACK SURGERY    . GALLBLADDER SURGERY    . ILIAC ARTERY STENT    . SHOULDER SURGERY      Social History Social History  Substance Use Topics  . Smoking status: Current Every Day Smoker    Packs/day: 1.00    Years: 44.00    Types: Cigarettes  . Smokeless tobacco: Current User  . Alcohol use Yes     Comment: soc    Family History Family History  Problem Relation Age of Onset  . Hypertension Mother   . Diabetes Mother     No  Known Allergies   REVIEW OF SYSTEMS (Negative unless checked)  Constitutional: [] Weight loss  [] Fever  [] Chills Cardiac: [] Chest pain   [] Chest pressure   [] Palpitations   [] Shortness of breath when laying flat   [] Shortness of breath with exertion. Vascular:  [x] Pain in legs with walking   [x] Pain in legs at rest  [] History of DVT   [] Phlebitis   [] Swelling in legs   [x] Varicose veins   [] Non-healing ulcers Pulmonary:   [] Uses home oxygen   [] Productive cough   [] Hemoptysis   [] Wheeze  [] COPD   [] Asthma Neurologic:  [] Dizziness   [] Seizures   [] History of stroke   [] History of TIA  [] Aphasia   [] Vissual changes   [] Weakness or numbness in arm   [] Weakness or numbness in leg Musculoskeletal:   [] Joint swelling   [] Joint pain   [] Low back pain Hematologic:  [] Easy bruising  [] Easy bleeding   [] Hypercoagulable state   [] Anemic Gastrointestinal:  [] Diarrhea   [] Vomiting  [] Gastroesophageal reflux/heartburn   [] Difficulty swallowing. Genitourinary:  [] Chronic kidney disease   [] Difficult urination  [] Frequent urination   [] Blood in urine Skin:  [] Rashes   [] Ulcers  Psychological:  [] History of anxiety   []  History of major depression.  Physical Examination  Vitals:  10/05/16 1440  BP: 121/63  Pulse: 70  Resp: 16  Weight: 175 lb (79.4 kg)  Height: 5\' 5"  (1.651 m)   Body mass index is 29.12 kg/m. Gen: WD/WN, NAD Head: [place]/AT, No temporalis wasting.  Ear/Nose/Throat: Hearing grossly intact, nares w/o erythema or drainage Eyes: PER, EOMI, sclera nonicteric.  Neck: Supple, no large masses.   Pulmonary:  Good air movement, no audible wheezing bilaterally, no use of accessory muscles.  Cardiac: RRR, no JVD Vascular:  Mild venous stasis changes to the legs bilaterally.  2+ soft pitting edema Vessel Right Left  Radial Palpable Palpable  PT Not Palpable Not Palpable  DP Not Palpable Not Palpable  Gastrointestinal: Non-distended. No guarding/no peritoneal signs.  Musculoskeletal: M/S 5/5  throughout.  No deformity or atrophy.  Neurologic: CN 2-12 intact. Symmetrical.  Speech is fluent. Motor exam as listed above. Psychiatric: Judgment intact, Mood & affect appropriate for pt's clinical situation. Dermatologic: mild venous  rashes no ulcers noted.  No changes consistent with cellulitis. Lymph : No lichenification or skin changes of chronic lymphedema.  CBC Lab Results  Component Value Date   WBC 12.2 (H) 07/03/2016   HGB 15.4 07/03/2016   HCT 46.2 07/03/2016   MCV 90.8 07/03/2016   PLT 236 07/03/2016    BMET    Component Value Date/Time   NA 141 05/25/2016 1700   K 3.6 05/25/2016 1700   CL 106 05/25/2016 1700   CO2 28 05/25/2016 1700   GLUCOSE 165 (H) 05/25/2016 1700   BUN 22 (H) 05/25/2016 1700   CREATININE 0.63 05/25/2016 1700   CALCIUM 9.3 05/25/2016 1700   GFRNONAA >60 05/25/2016 1700   GFRAA >60 05/25/2016 1700   CrCl cannot be calculated (Patient's most recent lab result is older than the maximum 21 days allowed.).  COAG No results found for: INR, PROTIME  Radiology No results found.   Assessment/Plan 1. Pain in both lower extremities Recommend:  I do not find evidence of Vascular pathology that would explain the patient's symptoms  The patient has atypical pain symptoms for vascular disease  Noninvasive studies including venous ultrasound of the legs do not identify vascular problems  The patient should continue walking and begin a more formal exercise program. The patient should continue his antiplatelet therapy and aggressive treatment of the lipid abnormalities. The patient should begin wearing graduated compression socks 15-20 mmHg strength to control her mild edema.  Further work-up of her lower extremity pain is deferred to the primary service and her chronic pain management     2. Peripheral arterial occlusive disease (HCC)  Recommend:  The patient has evidence of atherosclerosis of the lower extremities with claudication.  The  patient does not voice lifestyle limiting changes at this point in time.  Noninvasive studies do not suggest clinically significant change.  No invasive studies, angiography or surgery at this time The patient should continue walking and begin a more formal exercise program.  The patient should continue antiplatelet therapy and aggressive treatment of the lipid abnormalities  No changes in the patient's medications at this time  The patient should continue wearing graduated compression socks 10-15 mmHg strength to control the mild edema.   - VAS US AORTA/IVC/ILIACS; Future - VAS [place] ABI WITH/WO TBI; Future  3. Essential (primary) hypertension Continue antihypertensive medications as already ordered, these medications have been reviewed and there are no changes at this time.   4. COPD exacerbation ([place]) Continue pulmonary medications and aerosols as already ordered, these medications have been  reviewed and there are no changes at this time.    5. Type 2 diabetes mellitus with complication, with long-term current use of insulin (HCC) Continue hypoglycemic medications as already ordered, these medications have been reviewed and there are no changes at this time.  Hgb A1C to be monitored as already arranged by primary service   6. Chronic pain disorder Continue with pain management    Hortencia Pilar, MD  10/05/2016 8:28 PM

## 2016-10-31 ENCOUNTER — Telehealth: Payer: Self-pay | Admitting: Urgent Care

## 2016-10-31 ENCOUNTER — Inpatient Hospital Stay: Payer: Medicaid Other | Attending: Hematology and Oncology

## 2016-10-31 ENCOUNTER — Other Ambulatory Visit: Payer: Self-pay | Admitting: Urgent Care

## 2016-10-31 DIAGNOSIS — F17210 Nicotine dependence, cigarettes, uncomplicated: Secondary | ICD-10-CM | POA: Insufficient documentation

## 2016-10-31 DIAGNOSIS — R05 Cough: Secondary | ICD-10-CM | POA: Diagnosis not present

## 2016-10-31 DIAGNOSIS — M791 Myalgia: Secondary | ICD-10-CM | POA: Diagnosis not present

## 2016-10-31 DIAGNOSIS — G8929 Other chronic pain: Secondary | ICD-10-CM | POA: Insufficient documentation

## 2016-10-31 DIAGNOSIS — Z792 Long term (current) use of antibiotics: Secondary | ICD-10-CM | POA: Diagnosis not present

## 2016-10-31 DIAGNOSIS — M25519 Pain in unspecified shoulder: Secondary | ICD-10-CM | POA: Insufficient documentation

## 2016-10-31 DIAGNOSIS — K0889 Other specified disorders of teeth and supporting structures: Secondary | ICD-10-CM | POA: Diagnosis not present

## 2016-10-31 DIAGNOSIS — Z79899 Other long term (current) drug therapy: Secondary | ICD-10-CM | POA: Insufficient documentation

## 2016-10-31 DIAGNOSIS — F439 Reaction to severe stress, unspecified: Secondary | ICD-10-CM | POA: Diagnosis not present

## 2016-10-31 DIAGNOSIS — M549 Dorsalgia, unspecified: Secondary | ICD-10-CM | POA: Insufficient documentation

## 2016-10-31 DIAGNOSIS — E7800 Pure hypercholesterolemia, unspecified: Secondary | ICD-10-CM | POA: Diagnosis not present

## 2016-10-31 DIAGNOSIS — E1165 Type 2 diabetes mellitus with hyperglycemia: Secondary | ICD-10-CM | POA: Diagnosis not present

## 2016-10-31 DIAGNOSIS — D72829 Elevated white blood cell count, unspecified: Secondary | ICD-10-CM | POA: Insufficient documentation

## 2016-10-31 DIAGNOSIS — R3915 Urgency of urination: Secondary | ICD-10-CM | POA: Insufficient documentation

## 2016-10-31 DIAGNOSIS — R42 Dizziness and giddiness: Secondary | ICD-10-CM | POA: Insufficient documentation

## 2016-10-31 DIAGNOSIS — R5383 Other fatigue: Secondary | ICD-10-CM | POA: Insufficient documentation

## 2016-10-31 DIAGNOSIS — G4700 Insomnia, unspecified: Secondary | ICD-10-CM | POA: Insufficient documentation

## 2016-10-31 DIAGNOSIS — R110 Nausea: Secondary | ICD-10-CM | POA: Diagnosis not present

## 2016-10-31 LAB — CBC WITH DIFFERENTIAL/PLATELET
BASOS ABS: 0 10*3/uL (ref 0–0.1)
BASOS PCT: 0 %
Eosinophils Absolute: 0.2 10*3/uL (ref 0–0.7)
Eosinophils Relative: 1 %
HEMATOCRIT: 45.6 % (ref 35.0–47.0)
HEMOGLOBIN: 15.7 g/dL (ref 12.0–16.0)
LYMPHS PCT: 28 %
Lymphs Abs: 4.1 10*3/uL — ABNORMAL HIGH (ref 1.0–3.6)
MCH: 30.6 pg (ref 26.0–34.0)
MCHC: 34.5 g/dL (ref 32.0–36.0)
MCV: 88.7 fL (ref 80.0–100.0)
MONO ABS: 0.6 10*3/uL (ref 0.2–0.9)
Monocytes Relative: 4 %
NEUTROS ABS: 9.9 10*3/uL — AB (ref 1.4–6.5)
NEUTROS PCT: 67 %
Platelets: 248 10*3/uL (ref 150–440)
RBC: 5.14 MIL/uL (ref 3.80–5.20)
RDW: 15 % — AB (ref 11.5–14.5)
WBC: 14.8 10*3/uL — ABNORMAL HIGH (ref 3.6–11.0)

## 2016-10-31 LAB — COMPREHENSIVE METABOLIC PANEL
ALT: 21 U/L (ref 14–54)
ANION GAP: 6 (ref 5–15)
AST: 22 U/L (ref 15–41)
Albumin: 4 g/dL (ref 3.5–5.0)
Alkaline Phosphatase: 96 U/L (ref 38–126)
BILIRUBIN TOTAL: 0.6 mg/dL (ref 0.3–1.2)
BUN: 14 mg/dL (ref 6–20)
CALCIUM: 9.7 mg/dL (ref 8.9–10.3)
CO2: 31 mmol/L (ref 22–32)
Chloride: 102 mmol/L (ref 101–111)
Creatinine, Ser: 0.75 mg/dL (ref 0.44–1.00)
GFR calc Af Amer: >60 mL/min (ref >60)
Glucose, Bld: 233 mg/dL — ABNORMAL HIGH (ref 65–99)
POTASSIUM: 4.6 mmol/L (ref 3.5–5.1)
Sodium: 139 mmol/L (ref 135–145)
TOTAL PROTEIN: 7.2 g/dL (ref 6.5–8.1)

## 2016-10-31 LAB — TSH: TSH: 1.953 u[IU]/mL (ref 0.350–4.500)

## 2016-10-31 NOTE — Telephone Encounter (Signed)
Patient presents to the registration desk at the clinic today asking to be seen. Patient reports that she has been tired, more short of breath, and generally "not feeling well". Patient asking for lab work. Spoke with Dr. Mike Gip and chart reviewed. Patient was last seen in the hematology clinic on 08/31/2016 for what was diagnosed as a reactive leukocytosis. Last CBC on 07/03/2016 demonstrated a WBC 12.2 with an ANC of 7000, platelets 236,000. Dr. Mike Gip with orders for a CBC with diff, CMP, and TSH. Patient to be added to the clinic schedule to be seen this week.

## 2016-10-31 NOTE — Progress Notes (Signed)
Patient presents to the registration desk at the clinic today asking to be seen. Patient reports that she has been tired, more short of breath, and generally "not feeling well". Patient asking for lab work. Spoke with Dr. Mike Gip and chart reviewed. Patient was last seen in the hematology clinic on 08/31/2016 for what was diagnosed as a reactive leukocytosis. Last CBC on 07/03/2016 demonstrated a WBC 12.2 with an ANC of 7000, platelets 236,000. Dr. Mike Gip with orders for a CBC with diff, CMP, and TSH. Patient to be added to the clinic schedule to be seen this week.

## 2016-11-03 ENCOUNTER — Inpatient Hospital Stay: Payer: Medicaid Other | Admitting: Hematology and Oncology

## 2016-11-03 NOTE — Progress Notes (Deleted)
[facility] day:  11/03/2016  Chief Complaint: Autumn Faulkner is a 56 y.o. female with leukocytosis who is seen for a sick call visit.  HPI:  The patient was last seen in the hematology clinic on 08/31/2016.  At that time, she denied any complaints.  WBC was 12,200 on 07/03/2016.  Leukocytosis work-up was negative.  She was felt to have reactive leukocytosis possibly related to smoking.  Smoking cessation was discussed.  She was to follow-up as needed.  Bilateral mammogram on 09/04/2016 revealed no evidence of malignancy.  She was seen in the [facility] on 09/20/2016 for dental pain.  Multiple caries were noted. Right upper tooth #3 fractions to gumline with moderate surrounding tenderness and mild gum swelling, no palpated or visualized dental abscess, also adjacent dental caries noted.  Antibiotic was prescribed.  She was to follow-up with her dentist.  She was seen by Dr. Ronalee Belts on 10/05/2016 for bilateral lower extremity pain.  Venous duplex was negative for DVT or reflux disease. He did not find evidence of vascular pathology that would explain the patient's symptoms.  He recommended continued walking and beginning a more formal exercise program, antiplatelet therapy and treatment of the lipid abnormalities, and graduated compression socks to control mild edema. Further work-up of her lower extremity pain was deferred to the primary service and her chronic pain management.   She contacted the office on 10/31/2016 secondary to fatigue, shortness of breath and "not feeling well".  Labs on 10/31/2016 revealed a hematocrit of 45.6, hemoglobin 15.7, MCV 88.7, platelets 248,000, white count 14,800 with an ANC of 9900. Absolute lymphocyte count was 4100.  Comprehensive metabolic panel was notable for glucose of 233.  TSH was normal.   Past Medical History:  Diagnosis Date  . Chronic back pain   . Chronic shoulder pain   . Diabetes mellitus   . High  cholesterol   . High cholesterol   . Neuropathy   . Neuropathy   . Tachycardia     Past Surgical History:  Procedure Laterality Date  . ABDOMINAL HYSTERECTOMY    . BACK SURGERY    . GALLBLADDER SURGERY    . ILIAC ARTERY STENT    . SHOULDER SURGERY      Family History  Problem Relation Age of Onset  . Hypertension Mother   . Diabetes Mother     Social History:  reports that she has been smoking Cigarettes.  She has a 44.00 pack-year smoking history. She uses smokeless tobacco. She reports that she drinks alcohol. She reports that she does not use drugs.  She began smoking at age 14.  She previously smoked 3 packs/day.  She is currently smoking; has decreased to < 1/2 pack/day.  She denies any exposure to radiation or toxins.  The patient is alone today.  Allergies: No Known Allergies  Current Medications: Current Outpatient Prescriptions  Medication Sig Dispense Refill  . albuterol (PROVENTIL HFA;VENTOLIN HFA) 108 (90 Base) MCG/ACT inhaler Inhale into the lungs.    . ALPRAZolam (XANAX) 1 MG tablet Take 0.5-1 mg by mouth 2 (two) times daily as needed for sleep or anxiety (Always takes 1 mg in the evening. May take 0.5 mg during the day .).     Marland Kitchen amoxicillin-clavulanate (AUGMENTIN) 875-125 MG tablet Take 1 tablet by mouth every 12 (twelve) hours. 20 tablet 0  . aspirin EC 81 MG tablet Take 81 mg by mouth at bedtime.    Marland Kitchen atenolol (TENORMIN) 50  MG tablet Take 50 mg by mouth daily.    Marland Kitchen atorvastatin (LIPITOR) 40 MG tablet Take 40 mg by mouth daily.    . clopidogrel (PLAVIX) 75 MG tablet Take 75 mg by mouth daily.    . Cyanocobalamin (VITAMIN B-12) 1000 MCG SUBL Place 1,000 mcg under the tongue daily.    . diazepam (VALIUM) 5 MG tablet Take 5 mg by mouth every 6 (six) hours as needed for anxiety.    . gabapentin (NEURONTIN) 800 MG tablet Take 800-1,600 mg by mouth 3 (three) times daily. Takes 800 mg twice a day and 1600 in the evening.    Marland Kitchen glimepiride (AMARYL) 2 MG tablet Take by  mouth.    . insulin glargine (LANTUS) 100 UNIT/ML injection Inject 60 Units into the skin at bedtime.    . lidocaine (XYLOCAINE) 2 % solution Use as directed 15 mLs in the mouth or throat every 8 (eight) hours as needed (dental pain, hold to area x 2-3 mins then spit). 85 mL 0  . metFORMIN (GLUCOPHAGE) 500 MG tablet Take 500 mg by mouth 3 (three) times daily.     Marland Kitchen oxyCODONE-acetaminophen (PERCOCET) 10-650 MG tablet Take 1 tablet by mouth every 6 (six) hours as needed for pain.     No current facility-administered medications for this visit.     Review of Systems:  GENERAL:  Fatigue.  Fever come and go.  No sweats.  Weight loss of 1 pound since last visit (39 pounds in 8 months). PERFORMANCE STATUS (ECOG):  1 HEENT:  Dental issues.  Top left tooth pulled.  Nasal congestion.  Poor vision secondary to diabetes. Lungs: Shortness of breath at times.  Chronic cough.  No hemoptysis. Cardiac:  No chest pain, palpitations, orthopnea, or PND. GI:  Stools sometimes dark.  No nausea, vomiting, constipation, diarrhea, melena or hematochezia.  Polyps.  Last colonoscopy 05/2014. GU:  Chronic urgency/full feeling.  No frequency, dysuria, or hematuria. Breasts:  No breast concerns.  Last mammogram 8 years ago. Musculoskeletal:  Back fusion.  Feet and hips chronically hurt.  No muscle tenderness. Extremities:  Tender legs s/p stents.  No swelling. Skin:  Scabs on shoulders.  No rashes. Neuro:  Dizzy.  Forgetful.  No headache, numbness or weakness, balance or coordination issues. Endocrine:  Diabetes, poorly controlled.  No thyroid issues, hot flashes or night sweats. Psych:  Lots of stress.  Poor sleep.   Pain: No pain. Review of systems:  All other systems reviewed and found to be negative.  Physical Exam: There were no vitals taken for this visit. GENERAL:  Well developed, well nourished, woman sitting comfortably in the exam room in no acute distress. MENTAL STATUS:  Alert and oriented to person,  place and time. HEAD: Long blonde hair with dark roots.  Normocephalic, atraumatic, face symmetric, no Cushingoid features. EYES:  Blue eyes.  Pupils equal round and reactive to light and accomodation.  No conjunctivitis or scleral icterus. ENT: No oral lesions.  Tongue normal. Mucous membranes moist.  RESPIRATORY:  Clear to auscultation without rales, wheezes or rhonchi. CARDIOVASCULAR:  Regular rate and rhythm without murmur, rub or gallop. ABDOMEN:  Soft, non-tender, with active bowel sounds, and no appreciable hepatosplenomegaly.  No masses. SKIN:  No rashes, ulcers or lesions. EXTREMITIES: No edema, no skin discoloration or tenderness.  No palpable cords. LYMPH NODES: No palpable cervical, supraclavicular, axillary or inguinal adenopathy  NEUROLOGICAL: Unremarkable. PSYCH:  Appropriate.   No visits with results within 3 Day(s) from this visit.  Latest known visit with results is:  Appointment on 10/31/2016  Component Date Value Ref Range Status  . WBC 10/31/2016 14.8* 3.6 - 11.0 K/uL Final  . RBC 10/31/2016 5.14  3.80 - 5.20 MIL/uL Final  . Hemoglobin 10/31/2016 15.7  12.0 - 16.0 g/dL Final  . HCT 10/31/2016 45.6  35.0 - 47.0 % Final  . MCV 10/31/2016 88.7  80.0 - 100.0 fL Final  . MCH 10/31/2016 30.6  26.0 - 34.0 pg Final  . MCHC 10/31/2016 34.5  32.0 - 36.0 g/dL Final  . RDW 10/31/2016 15.0* 11.5 - 14.5 % Final  . Platelets 10/31/2016 248  150 - 440 K/uL Final  . Neutrophils Relative % 10/31/2016 67  % Final  . Neutro Abs 10/31/2016 9.9* 1.4 - 6.5 K/uL Final  . Lymphocytes Relative 10/31/2016 28  % Final  . Lymphs Abs 10/31/2016 4.1* 1.0 - 3.6 K/uL Final  . Monocytes Relative 10/31/2016 4  % Final  . Monocytes Absolute 10/31/2016 0.6  0.2 - 0.9 K/uL Final  . Eosinophils Relative 10/31/2016 1  % Final  . Eosinophils Absolute 10/31/2016 0.2  0 - 0.7 K/uL Final  . Basophils Relative 10/31/2016 0  % Final  . Basophils Absolute 10/31/2016 0.0  0 - 0.1 K/uL Final  . Sodium  10/31/2016 139  135 - 145 mmol/L Final  . Potassium 10/31/2016 4.6  3.5 - 5.1 mmol/L Final  . Chloride 10/31/2016 102  101 - 111 mmol/L Final  . CO2 10/31/2016 31  22 - 32 mmol/L Final  . Glucose, Bld 10/31/2016 233* 65 - 99 mg/dL Final  . BUN 10/31/2016 14  6 - 20 mg/dL Final  . Creatinine, Ser 10/31/2016 0.75  0.44 - 1.00 mg/dL Final  . Calcium 10/31/2016 9.7  8.9 - 10.3 mg/dL Final  . Total Protein 10/31/2016 7.2  6.5 - 8.1 g/dL Final  . Albumin 10/31/2016 4.0  3.5 - 5.0 g/dL Final  . AST 10/31/2016 22  15 - 41 U/L Final  . ALT 10/31/2016 21  14 - 54 U/L Final  . Alkaline Phosphatase 10/31/2016 96  38 - 126 U/L Final  . Total Bilirubin 10/31/2016 0.6  0.3 - 1.2 mg/dL Final  . GFR calc non Af Amer 10/31/2016 >60  >60 mL/min Final  . GFR calc Af Amer 10/31/2016 >60  >60 mL/min Final   Comment: (NOTE) The eGFR has been calculated using the CKD EPI equation. This calculation has not been validated in all clinical situations. eGFR's persistently <60 mL/min signify possible Chronic Kidney Disease.   . Anion gap 10/31/2016 6  5 - 15 Final  . TSH 10/31/2016 1.953  0.350 - 4.500 uIU/mL Final   Performed by a 3rd Generation assay with a functional sensitivity of <=0.01 uIU/mL.    Assessment:  Autumn Faulkner is a 56 y.o. female with apparent reactive leukocytosis.  She has a history of an elevated WBC count for 5-8 years.  WBC has ranged between 11,400 - 17,100.  She has a history of infections (UTI x 3 in past 6-8 months, pneumonia 3 years ago, chronic dental issues).  She smokes.  Her husband had hepatitis C.  Work-up on 05/25/2016 revealed a hematocrit of 41.7, hemoglobin 14.1, platelets 245,000, white count 15,600 with an Andrews of 10,600. Differential included 67% segs, 27% lymphs, 4% monocytes, 1% eosinophils and 1% basophils.  Normal studies included:  ESR, SPEP, immunoglobulins, hepatitis B core antibody total, hepatitis C antibody, and HIV testing, BCR-ABL, and JAK2 (V617F and exon  12-15).  Low dose chest CT on 07/05/2016 revealed a 2.4 mm isolated right upper lobe pulmonary nodule and mild centrilobular emphysema.  Annual screening with low-dose chest CT without contrast was recommended in 12 months.  Last colonoscopy was on 05/21/2014.  She had polyps.  Bilateral mammogram on 09/04/2016 revealed no evidence of malignancy.  TSH was normal on 07/03/2016.  Symptomatically, she notes issues with constipation.  WBC was 12,200 on 07/03/2016.  Plan: 1.  Labs today:  CBC with diff, CMP, TSH.  3.  Annual screening with low-dose chest CT without contrast on 07/05/2017.    Lequita Asal, MD  11/03/2016, 5:49 AM

## 2016-11-07 ENCOUNTER — Inpatient Hospital Stay: Payer: Medicaid Other | Admitting: Hematology and Oncology

## 2016-11-07 ENCOUNTER — Inpatient Hospital Stay: Payer: Medicaid Other

## 2016-11-07 ENCOUNTER — Inpatient Hospital Stay (HOSPITAL_BASED_OUTPATIENT_CLINIC_OR_DEPARTMENT_OTHER): Payer: Medicaid Other | Admitting: Hematology and Oncology

## 2016-11-07 VITALS — BP 134/78 | HR 97 | Temp 97.9°F | Resp 18 | Wt 183.5 lb

## 2016-11-07 DIAGNOSIS — K0889 Other specified disorders of teeth and supporting structures: Secondary | ICD-10-CM | POA: Diagnosis not present

## 2016-11-07 DIAGNOSIS — R42 Dizziness and giddiness: Secondary | ICD-10-CM | POA: Diagnosis not present

## 2016-11-07 DIAGNOSIS — G4700 Insomnia, unspecified: Secondary | ICD-10-CM

## 2016-11-07 DIAGNOSIS — R05 Cough: Secondary | ICD-10-CM

## 2016-11-07 DIAGNOSIS — G8929 Other chronic pain: Secondary | ICD-10-CM

## 2016-11-07 DIAGNOSIS — K5900 Constipation, unspecified: Secondary | ICD-10-CM

## 2016-11-07 DIAGNOSIS — G894 Chronic pain syndrome: Secondary | ICD-10-CM

## 2016-11-07 DIAGNOSIS — E7800 Pure hypercholesterolemia, unspecified: Secondary | ICD-10-CM

## 2016-11-07 DIAGNOSIS — F17210 Nicotine dependence, cigarettes, uncomplicated: Secondary | ICD-10-CM

## 2016-11-07 DIAGNOSIS — Z792 Long term (current) use of antibiotics: Secondary | ICD-10-CM

## 2016-11-07 DIAGNOSIS — F439 Reaction to severe stress, unspecified: Secondary | ICD-10-CM

## 2016-11-07 DIAGNOSIS — M549 Dorsalgia, unspecified: Secondary | ICD-10-CM

## 2016-11-07 DIAGNOSIS — R5383 Other fatigue: Secondary | ICD-10-CM | POA: Diagnosis not present

## 2016-11-07 DIAGNOSIS — E1165 Type 2 diabetes mellitus with hyperglycemia: Secondary | ICD-10-CM | POA: Diagnosis not present

## 2016-11-07 DIAGNOSIS — K635 Polyp of colon: Secondary | ICD-10-CM

## 2016-11-07 DIAGNOSIS — Z79899 Other long term (current) drug therapy: Secondary | ICD-10-CM

## 2016-11-07 DIAGNOSIS — M25519 Pain in unspecified shoulder: Secondary | ICD-10-CM

## 2016-11-07 DIAGNOSIS — D72829 Elevated white blood cell count, unspecified: Secondary | ICD-10-CM | POA: Diagnosis not present

## 2016-11-07 DIAGNOSIS — M791 Myalgia: Secondary | ICD-10-CM | POA: Diagnosis not present

## 2016-11-07 DIAGNOSIS — R110 Nausea: Secondary | ICD-10-CM | POA: Diagnosis not present

## 2016-11-07 DIAGNOSIS — R3915 Urgency of urination: Secondary | ICD-10-CM

## 2016-11-07 NOTE — Progress Notes (Signed)
[facility] day:  11/07/2016  Chief Complaint: Autumn Faulkner is a 56 y.o. female with leukocytosis who is seen for a sick call visit.  HPI:  The patient was last seen in the hematology clinic on 08/31/2016.  At that time, she denied any complaints.  WBC was 12,200 on 07/03/2016.  Leukocytosis work-up was negative.  She was felt to have reactive leukocytosis possibly related to smoking.  Smoking cessation was discussed.  She was to follow-up as needed.  Bilateral mammogram on 09/04/2016 revealed no evidence of malignancy.  She was seen in the [facility] on 09/20/2016 for dental pain.  Multiple caries were noted. Right upper tooth #3 fractions to gumline with moderate surrounding tenderness and mild gum swelling, no palpated or visualized dental abscess, also adjacent dental caries noted.  Antibiotic was prescribed.  She was to follow-up with her dentist.  She was seen by Dr. Ronalee Belts on 10/05/2016 for bilateral lower extremity pain.  Venous duplex was negative for DVT or reflux disease. He did not find evidence of vascular pathology that would explain the patient's symptoms.  He recommended continued walking and beginning a more formal exercise program, antiplatelet therapy and treatment of the lipid abnormalities, and graduated compression socks to control mild edema. Further work-up of her lower extremity pain was deferred to the primary service and her chronic pain management.    She contacted the office on 10/31/2016 secondary to fatigue, shortness of breath and "not feeling well".  Labs on 10/31/2016 revealed a hematocrit of 45.6, hemoglobin 15.7, MCV 88.7, platelets 248,000, white count 14,800 with an ANC of 9900. Absolute lymphocyte count was 4100.  Comprehensive metabolic panel was notable for glucose of 233.  TSH was normal.  Symptomatically, patient with multiple complaints today. She has nausea, dizziness, fatigue, dental pain, and generalized  myalgias. She denies fevers. she states, "I hate waking up in the morning because of the pain. I am not depressed though"'; denies suicidal ideations. Patient very talkative. She states, "the Reita Cliche put it upon my heart to come to you rather than going to my regular doctor". She continues to smoke. Patient advising that she is having continued "vascular problems".  She saw Dr. Ronalee Belts, but does not want to go back.  She states that her RIGHT great toe intermittently turns "black".  Patient is falling around more per her report. She reports that her dentist "pulled the wrong tooth" and it "abscessed" last night. She is to see PCP regarding dental issues, as she is not willing to see her current dentist again.    Past Medical History:  Diagnosis Date  . Chronic back pain   . Chronic shoulder pain   . Diabetes mellitus   . High cholesterol   . High cholesterol   . Neuropathy   . Neuropathy   . Tachycardia     Past Surgical History:  Procedure Laterality Date  . ABDOMINAL HYSTERECTOMY    . BACK SURGERY    . GALLBLADDER SURGERY    . ILIAC ARTERY STENT    . SHOULDER SURGERY      Family History  Problem Relation Age of Onset  . Hypertension Mother   . Diabetes Mother     Social History:  reports that she has been smoking Cigarettes.  She has a 44.00 pack-year smoking history. She uses smokeless tobacco. She reports that she drinks alcohol. She reports that she does not use drugs.  She began smoking at age 38.  She previously smoked 3 packs/day.  She is currently smoking; has decreased to < 1/2 pack/day.  She denies any exposure to radiation or toxins.  The patient is alone today.  Allergies: No Known Allergies  Current Medications: Current Outpatient Prescriptions  Medication Sig Dispense Refill  . albuterol (PROVENTIL HFA;VENTOLIN HFA) 108 (90 Base) MCG/ACT inhaler Inhale into the lungs.    . ALPRAZolam (XANAX) 1 MG tablet Take 0.5-1 mg by mouth 2 (two) times daily as needed for sleep  or anxiety (Always takes 1 mg in the evening. May take 0.5 mg during the day .).     Marland Kitchen amoxicillin-clavulanate (AUGMENTIN) 875-125 MG tablet Take 1 tablet by mouth every 12 (twelve) hours. 20 tablet 0  . aspirin EC 81 MG tablet Take 81 mg by mouth at bedtime.    Marland Kitchen atenolol (TENORMIN) 50 MG tablet Take 50 mg by mouth daily.    Marland Kitchen atorvastatin (LIPITOR) 40 MG tablet Take 40 mg by mouth daily.    . clopidogrel (PLAVIX) 75 MG tablet Take 75 mg by mouth daily.    . Cyanocobalamin (VITAMIN B-12) 1000 MCG SUBL Place 1,000 mcg under the tongue daily.    . diazepam (VALIUM) 5 MG tablet Take 5 mg by mouth every 6 (six) hours as needed for anxiety.    . gabapentin (NEURONTIN) 800 MG tablet Take 800-1,600 mg by mouth 3 (three) times daily. Takes 800 mg twice a day and 1600 in the evening.    Marland Kitchen glimepiride (AMARYL) 2 MG tablet Take by mouth.    . insulin glargine (LANTUS) 100 UNIT/ML injection Inject 60 Units into the skin at bedtime.    . lidocaine (XYLOCAINE) 2 % solution Use as directed 15 mLs in the mouth or throat every 8 (eight) hours as needed (dental pain, hold to area x 2-3 mins then spit). 85 mL 0  . metFORMIN (GLUCOPHAGE) 500 MG tablet Take 500 mg by mouth 3 (three) times daily.     Marland Kitchen oxyCODONE-acetaminophen (PERCOCET) 10-650 MG tablet Take 1 tablet by mouth every 6 (six) hours as needed for pain.     No current facility-administered medications for this visit.     Review of Systems:  GENERAL:  Fatigue.  Fever come and go.  No sweats.  Weight gain of 8lbs since last visit. PERFORMANCE STATUS (ECOG):  1 HEENT:  Dental issues.  Top left tooth pulled ("wrong per patient").  Nasal congestion.  Poor vision secondary to diabetes. Lungs: Shortness of breath at times.  Black mold in house.  Chronic cough.  No hemoptysis. Cardiac:  No chest pain, palpitations, orthopnea, or PND. GI:  Stools sometimes dark.  No bowel movement in 2 weeks.  No nausea, vomiting, constipation, diarrhea, melena or  hematochezia.  Polyps.  Last colonoscopy 05/2014. GU:  Chronic urgency/full feeling.  No frequency, dysuria, or hematuria. Breasts:  No breast concerns.  Mammogram 08/2016 negative. Musculoskeletal:  Back fusion.  Bones and muscles "shrinking".  Whole body hurts.  No muscle tenderness. Extremities:  Tender legs s/p stents.  Right toe intermittently "black".  No swelling. Skin:  Scabs on shoulders.  No rashes. Neuro:  Dizzy.  Forgetful.  No headache, numbness or weakness, balance or coordination issues. Endocrine:  Diabetes, poorly controlled.  No thyroid issues, hot flashes or night sweats. Psych:  Stress.  Poor sleep.   Pain: No pain. Review of systems:  All other systems reviewed and found to be negative.  Physical Exam: Blood pressure 133/79, pulse 78, temperature 97.9 F (36.6  C), temperature source Tympanic, resp. rate 18, weight 183 lb 8 oz (83.2 kg). GENERAL:  Well developed, well nourished, woman sitting comfortably in the exam room in no acute distress. MENTAL STATUS:  Alert and oriented to person, place and time. HEAD: Long blonde hair.  Normocephalic, atraumatic, face symmetric, no Cushingoid features. EYES:  Blue eyes.  Pupils equal round and reactive to light and accomodation.  No conjunctivitis or scleral icterus. ENT: No oral lesions.  s/p upper right molar extracted.  Tongue normal. Mucous membranes moist.  RESPIRATORY:  Clear to auscultation without rales, wheezes or rhonchi. CARDIOVASCULAR:  Regular rate and rhythm without murmur, rub or gallop. ABDOMEN:  Soft, non-tender, with active bowel sounds, and no appreciable hepatosplenomegaly.  No masses. SKIN:  No rashes, ulcers or lesions. EXTREMITIES: No edema, no skin discoloration or tenderness.  No palpable cords.  No swollen or tender joints.  Shoes removed.  Pulses intact.  Toes pink with brisk capillary refill. LYMPH NODES: No palpable cervical, supraclavicular, axillary or inguinal adenopathy  NEUROLOGICAL:  Unremarkable. PSYCH:  Pressured speech.   No visits with results within 3 Day(s) from this visit.  Latest known visit with results is:  Appointment on 10/31/2016  Component Date Value Ref Range Status  . WBC 10/31/2016 14.8* 3.6 - 11.0 K/uL Final  . RBC 10/31/2016 5.14  3.80 - 5.20 MIL/uL Final  . Hemoglobin 10/31/2016 15.7  12.0 - 16.0 g/dL Final  . HCT 10/31/2016 45.6  35.0 - 47.0 % Final  . MCV 10/31/2016 88.7  80.0 - 100.0 fL Final  . MCH 10/31/2016 30.6  26.0 - 34.0 pg Final  . MCHC 10/31/2016 34.5  32.0 - 36.0 g/dL Final  . RDW 10/31/2016 15.0* 11.5 - 14.5 % Final  . Platelets 10/31/2016 248  150 - 440 K/uL Final  . Neutrophils Relative % 10/31/2016 67  % Final  . Neutro Abs 10/31/2016 9.9* 1.4 - 6.5 K/uL Final  . Lymphocytes Relative 10/31/2016 28  % Final  . Lymphs Abs 10/31/2016 4.1* 1.0 - 3.6 K/uL Final  . Monocytes Relative 10/31/2016 4  % Final  . Monocytes Absolute 10/31/2016 0.6  0.2 - 0.9 K/uL Final  . Eosinophils Relative 10/31/2016 1  % Final  . Eosinophils Absolute 10/31/2016 0.2  0 - 0.7 K/uL Final  . Basophils Relative 10/31/2016 0  % Final  . Basophils Absolute 10/31/2016 0.0  0 - 0.1 K/uL Final  . Sodium 10/31/2016 139  135 - 145 mmol/L Final  . Potassium 10/31/2016 4.6  3.5 - 5.1 mmol/L Final  . Chloride 10/31/2016 102  101 - 111 mmol/L Final  . CO2 10/31/2016 31  22 - 32 mmol/L Final  . Glucose, Bld 10/31/2016 233* 65 - 99 mg/dL Final  . BUN 10/31/2016 14  6 - 20 mg/dL Final  . Creatinine, Ser 10/31/2016 0.75  0.44 - 1.00 mg/dL Final  . Calcium 10/31/2016 9.7  8.9 - 10.3 mg/dL Final  . Total Protein 10/31/2016 7.2  6.5 - 8.1 g/dL Final  . Albumin 10/31/2016 4.0  3.5 - 5.0 g/dL Final  . AST 10/31/2016 22  15 - 41 U/L Final  . ALT 10/31/2016 21  14 - 54 U/L Final  . Alkaline Phosphatase 10/31/2016 96  38 - 126 U/L Final  . Total Bilirubin 10/31/2016 0.6  0.3 - 1.2 mg/dL Final  . GFR calc non Af Amer 10/31/2016 >60  >60 mL/min Final  . GFR calc Af Amer  10/31/2016 >60  >60 mL/min Final  Comment: (NOTE) The eGFR has been calculated using the CKD EPI equation. This calculation has not been validated in all clinical situations. eGFR's persistently <60 mL/min signify possible Chronic Kidney Disease.   . Anion gap 10/31/2016 6  5 - 15 Final  . TSH 10/31/2016 1.953  0.350 - 4.500 uIU/mL Final   Performed by a 3rd Generation assay with a functional sensitivity of <=0.01 uIU/mL.    Assessment:  Autumn Faulkner is a 56 y.o. female with apparent reactive leukocytosis.  She has a history of an elevated WBC count for 5-8 years.  WBC has ranged between 11,400 - 17,100.  She has a history of infections (UTI x 3 in past 6-8 months, pneumonia 3 years ago, chronic dental issues).  She smokes.  Her husband had hepatitis C.  Work-up on 05/25/2016 revealed a hematocrit of 41.7, hemoglobin 14.1, platelets 245,000, white count 15,600 with an Douglass of 10,600. Differential included 67% segs, 27% lymphs, 4% monocytes, 1% eosinophils and 1% basophils.  Normal studies included:  ESR, SPEP, immunoglobulins, hepatitis B core antibody total, hepatitis C antibody, and HIV testing, BCR-ABL, and JAK2 ([id number] and exon 12-15).  Low dose chest CT on 07/05/2016 revealed a 2.4 mm isolated right upper lobe pulmonary nodule and mild centrilobular emphysema.  Annual screening with low-dose chest CT without contrast was recommended in 12 months.  Last colonoscopy was on 05/21/2014.  She had polyps.  Bilateral mammogram on 09/04/2016 revealed no evidence of malignancy.  TSH was normal on 07/03/2016.  Symptomatically, patient has a diffusely positive review of systems today. Patient has nausea, dizziness, dental pain, and generalized myalgias.  CBC on 10/31/2016 was normal except for a slightly elevated WBC (14,800). CMP and TSH unremarkable.   Plan: 1.  Review labs from 10/31/2016.  There are no concerning labs.  WBC is slightly elevated.  She has had an extensive work-up in the past.   She has reactive leukocytosis.  Encouraged smoking cessation.  She may have fibromyalgia. 2.  Labs today: ANA, rheumatoid factor. 3.  Refer to Dr. Gustavo Lah (GI) secondary to multiple polyps and constipation.  Prior notes indicate a need for colonoscopy in 05/2017. 4.  RTC prn.  Discuss working closely with Dr. Kary Kos to coordinate care needs.    Lequita Asal, MD  11/07/2016, 11:39 AM

## 2016-11-07 NOTE — Progress Notes (Signed)
Patient presents today with multiple complaints.  States her bones and muscles are shrinking.  States she has not had a bowel movement in 2 weeks.  States she has been dizzy for past 3 weeks and had had multiple falls.  States she has pain all over her body.  She is moving this weekend.  States the house she is currently living in is full of Collyn Ribas mold.  States she is having problems with memory.  States "my brain ain't working right".

## 2016-11-07 NOTE — Progress Notes (Deleted)
Autumn Faulkner day:  11/07/2016  Chief Complaint: Autumn Faulkner is a 56 y.o. female with leukocytosis who is seen for a sick call visit.  HPI:  The patient was last seen in the hematology [facility] on 08/31/2016.  At that time, she denied any complaints.  WBC was 12,200 on 07/03/2016.  Leukocytosis work-up was negative.  She was felt to have reactive leukocytosis possibly related to smoking.  Smoking cessation was discussed.  She was to follow-up as needed.  Bilateral mammogram on 09/04/2016 revealed no evidence of malignancy.  She was seen in the [facility] ER on 09/20/2016 for dental pain.  Multiple caries were noted. Right upper tooth #3 fractions to gumline with moderate surrounding tenderness and mild gum swelling, no palpated or visualized dental abscess, also adjacent dental caries noted.  Antibiotic was prescribed.  She was to follow-up with her dentist.  She was seen by Dr. Ronalee Belts on 10/05/2016 for bilateral lower extremity pain.  Venous duplex was negative for DVT or reflux disease. He did not find evidence of vascular pathology that would explain the patient's symptoms.  He recommended continued walking and beginning a more formal exercise program, antiplatelet therapy and treatment of the lipid abnormalities, and graduated compression socks to control mild edema. Further work-up of her lower extremity pain was deferred to the primary service and her chronic pain management.   She contacted the office on 10/31/2016 secondary to fatigue, shortness of breath and "not feeling well".  Labs on 10/31/2016 revealed a hematocrit of 45.6, hemoglobin 15.7, MCV 88.7, platelets 248,000, white count 14,800 with an ANC of 9900. Absolute lymphocyte count was 4100.  Comprehensive metabolic panel was notable for glucose of 233.  TSH was normal.   Past Medical History:  Diagnosis Date  . Chronic back pain   . Chronic shoulder pain   . Diabetes mellitus   . High  cholesterol   . High cholesterol   . Neuropathy   . Neuropathy   . Tachycardia     Past Surgical History:  Procedure Laterality Date  . ABDOMINAL HYSTERECTOMY    . BACK SURGERY    . GALLBLADDER SURGERY    . ILIAC ARTERY STENT    . SHOULDER SURGERY      Family History  Problem Relation Age of Onset  . Hypertension Mother   . Diabetes Mother     Social History:  reports that she has been smoking Cigarettes.  She has a 44.00 pack-year smoking history. She uses smokeless tobacco. She reports that she drinks alcohol. She reports that she does not use drugs.  She began smoking at age 54.  She previously smoked 3 packs/day.  She is currently smoking; has decreased to < 1/2 pack/day.  She denies any exposure to radiation or toxins.  The patient is alone today.  Allergies: No Known Allergies  Current Medications: Current Outpatient Prescriptions  Medication Sig Dispense Refill  . albuterol (PROVENTIL HFA;VENTOLIN HFA) 108 (90 Base) MCG/ACT inhaler Inhale into the lungs.    . ALPRAZolam (XANAX) 1 MG tablet Take 0.5-1 mg by mouth 2 (two) times daily as needed for sleep or anxiety (Always takes 1 mg in the evening. May take 0.5 mg during the day .).     Marland Kitchen amoxicillin-clavulanate (AUGMENTIN) 875-125 MG tablet Take 1 tablet by mouth every 12 (twelve) hours. 20 tablet 0  . aspirin EC 81 MG tablet Take 81 mg by mouth at bedtime.    Marland Kitchen atenolol (TENORMIN) 50  MG tablet Take 50 mg by mouth daily.    Marland Kitchen atorvastatin (LIPITOR) 40 MG tablet Take 40 mg by mouth daily.    . clopidogrel (PLAVIX) 75 MG tablet Take 75 mg by mouth daily.    . Cyanocobalamin (VITAMIN B-12) 1000 MCG SUBL Place 1,000 mcg under the tongue daily.    . diazepam (VALIUM) 5 MG tablet Take 5 mg by mouth every 6 (six) hours as needed for anxiety.    . gabapentin (NEURONTIN) 800 MG tablet Take 800-1,600 mg by mouth 3 (three) times daily. Takes 800 mg twice a day and 1600 in the evening.    Marland Kitchen glimepiride (AMARYL) 2 MG tablet Take by  mouth.    . insulin glargine (LANTUS) 100 UNIT/ML injection Inject 60 Units into the skin at bedtime.    . lidocaine (XYLOCAINE) 2 % solution Use as directed 15 mLs in the mouth or throat every 8 (eight) hours as needed (dental pain, hold to area x 2-3 mins then spit). 85 mL 0  . metFORMIN (GLUCOPHAGE) 500 MG tablet Take 500 mg by mouth 3 (three) times daily.     Marland Kitchen oxyCODONE-acetaminophen (PERCOCET) 10-650 MG tablet Take 1 tablet by mouth every 6 (six) hours as needed for pain.     No current facility-administered medications for this visit.     Review of Systems:  GENERAL:  Fatigue.  Fever come and go.  No sweats.  Weight loss of 1 pound since last visit (39 pounds in 8 months). PERFORMANCE STATUS (ECOG):  1 HEENT:  Dental issues.  Top left tooth pulled.  Nasal congestion.  Poor vision secondary to diabetes. Lungs: Shortness of breath at times.  Chronic cough.  No hemoptysis. Cardiac:  No chest pain, palpitations, orthopnea, or PND. GI:  Stools sometimes dark.  No nausea, vomiting, constipation, diarrhea, melena or hematochezia.  Polyps.  Last colonoscopy 05/2014. GU:  Chronic urgency/full feeling.  No frequency, dysuria, or hematuria. Breasts:  No breast concerns.  Last mammogram 8 years ago. Musculoskeletal:  Back fusion.  Feet and hips chronically hurt.  No muscle tenderness. Extremities:  Tender legs s/p stents.  No swelling. Skin:  Scabs on shoulders.  No rashes. Neuro:  Dizzy.  Forgetful.  No headache, numbness or weakness, balance or coordination issues. Endocrine:  Diabetes, poorly controlled.  No thyroid issues, hot flashes or night sweats. Psych:  Lots of stress.  Poor sleep.   Pain: No pain. Review of systems:  All other systems reviewed and found to be negative.  Physical Exam: There were no vitals taken for this visit. GENERAL:  Well developed, well nourished, woman sitting comfortably in the exam room in no acute distress. MENTAL STATUS:  Alert and oriented to person,  place and time. HEAD: Long blonde hair with dark roots.  Normocephalic, atraumatic, face symmetric, no Cushingoid features. EYES:  Blue eyes.  Pupils equal round and reactive to light and accomodation.  No conjunctivitis or scleral icterus. ENT: No oral lesions.  Tongue normal. Mucous membranes moist.  RESPIRATORY:  Clear to auscultation without rales, wheezes or rhonchi. CARDIOVASCULAR:  Regular rate and rhythm without murmur, rub or gallop. ABDOMEN:  Soft, non-tender, with active bowel sounds, and no appreciable hepatosplenomegaly.  No masses. SKIN:  No rashes, ulcers or lesions. EXTREMITIES: No edema, no skin discoloration or tenderness.  No palpable cords. LYMPH NODES: No palpable cervical, supraclavicular, axillary or inguinal adenopathy  NEUROLOGICAL: Unremarkable. PSYCH:  Appropriate.   No visits with results within 3 Day(s) from this visit.  Latest known visit with results is:  Appointment on 10/31/2016  Component Date Value Ref Range Status  . WBC 10/31/2016 14.8* 3.6 - 11.0 K/uL Final  . RBC 10/31/2016 5.14  3.80 - 5.20 MIL/uL Final  . Hemoglobin 10/31/2016 15.7  12.0 - 16.0 g/dL Final  . HCT 10/31/2016 45.6  35.0 - 47.0 % Final  . MCV 10/31/2016 88.7  80.0 - 100.0 fL Final  . MCH 10/31/2016 30.6  26.0 - 34.0 pg Final  . MCHC 10/31/2016 34.5  32.0 - 36.0 g/dL Final  . RDW 10/31/2016 15.0* 11.5 - 14.5 % Final  . Platelets 10/31/2016 248  150 - 440 K/uL Final  . Neutrophils Relative % 10/31/2016 67  % Final  . Neutro Abs 10/31/2016 9.9* 1.4 - 6.5 K/uL Final  . Lymphocytes Relative 10/31/2016 28  % Final  . Lymphs Abs 10/31/2016 4.1* 1.0 - 3.6 K/uL Final  . Monocytes Relative 10/31/2016 4  % Final  . Monocytes Absolute 10/31/2016 0.6  0.2 - 0.9 K/uL Final  . Eosinophils Relative 10/31/2016 1  % Final  . Eosinophils Absolute 10/31/2016 0.2  0 - 0.7 K/uL Final  . Basophils Relative 10/31/2016 0  % Final  . Basophils Absolute 10/31/2016 0.0  0 - 0.1 K/uL Final  . Sodium  10/31/2016 139  135 - 145 mmol/L Final  . Potassium 10/31/2016 4.6  3.5 - 5.1 mmol/L Final  . Chloride 10/31/2016 102  101 - 111 mmol/L Final  . CO2 10/31/2016 31  22 - 32 mmol/L Final  . Glucose, Bld 10/31/2016 233* 65 - 99 mg/dL Final  . BUN 10/31/2016 14  6 - 20 mg/dL Final  . Creatinine, Ser 10/31/2016 0.75  0.44 - 1.00 mg/dL Final  . Calcium 10/31/2016 9.7  8.9 - 10.3 mg/dL Final  . Total Protein 10/31/2016 7.2  6.5 - 8.1 g/dL Final  . Albumin 10/31/2016 4.0  3.5 - 5.0 g/dL Final  . AST 10/31/2016 22  15 - 41 U/L Final  . ALT 10/31/2016 21  14 - 54 U/L Final  . Alkaline Phosphatase 10/31/2016 96  38 - 126 U/L Final  . Total Bilirubin 10/31/2016 0.6  0.3 - 1.2 mg/dL Final  . GFR calc non Af Amer 10/31/2016 >60  >60 mL/min Final  . GFR calc Af Amer 10/31/2016 >60  >60 mL/min Final   Comment: (NOTE) The eGFR has been calculated using the CKD EPI equation. This calculation has not been validated in all clinical situations. eGFR's persistently <60 mL/min signify possible Chronic Kidney Disease.   . Anion gap 10/31/2016 6  5 - 15 Final  . TSH 10/31/2016 1.953  0.350 - 4.500 uIU/mL Final   Performed by a 3rd Generation assay with a functional sensitivity of <=0.01 uIU/mL.    Assessment:  Autumn Faulkner is a 56 y.o. female with apparent reactive leukocytosis.  She has a history of an elevated WBC count for 5-8 years.  WBC has ranged between 11,400 - 17,100.  She has a history of infections (UTI x 3 in past 6-8 months, pneumonia 3 years ago, chronic dental issues).  She smokes.  Her husband had hepatitis C.  Work-up on 05/25/2016 revealed a hematocrit of 41.7, hemoglobin 14.1, platelets 245,000, white count 15,600 with an Andrews of 10,600. Differential included 67% segs, 27% lymphs, 4% monocytes, 1% eosinophils and 1% basophils.  Normal studies included:  ESR, SPEP, immunoglobulins, hepatitis B core antibody total, hepatitis C antibody, and HIV testing, BCR-ABL, and JAK2 (V617F and exon  12-15).  Low dose chest CT on 07/05/2016 revealed a 2.4 mm isolated right upper lobe pulmonary nodule and mild centrilobular emphysema.  Annual screening with low-dose chest CT without contrast was recommended in 12 months.  Last colonoscopy was on 05/21/2014.  She had polyps.  Bilateral mammogram on 09/04/2016 revealed no evidence of malignancy.  TSH was normal on 07/03/2016.  Symptomatically, she notes issues with constipation.  WBC was 12,200 on 07/03/2016.  Plan: 1.  Review labs from 10/31/2016.  3.  Annual screening with low-dose chest CT without contrast on 07/05/2017.    Lequita Asal, MD  11/07/2016, 5:10 AM

## 2016-11-08 ENCOUNTER — Encounter: Payer: Self-pay | Admitting: Hematology and Oncology

## 2016-11-08 LAB — RHEUMATOID FACTOR: Rhuematoid fact SerPl-aCnc: <10 IU/mL (ref 0.0–13.9)

## 2016-11-08 LAB — ANA W/REFLEX: Anti Nuclear Antibody(ANA): NEGATIVE

## 2017-01-01 ENCOUNTER — Encounter (INDEPENDENT_AMBULATORY_CARE_PROVIDER_SITE_OTHER): Payer: Medicaid Other

## 2017-01-01 ENCOUNTER — Ambulatory Visit (INDEPENDENT_AMBULATORY_CARE_PROVIDER_SITE_OTHER): Payer: Medicaid Other | Admitting: Vascular Surgery

## 2017-04-24 IMAGING — CT CT RENAL STONE PROTOCOL
1 of 2 series · 15 of 32 positions shown, 19 images · non-contrast
Comparison: 12/18/2014

CLINICAL DATA: Right flank pain, microscopic hematuria, UTIs

EXAM:
CT ABDOMEN AND PELVIS WITHOUT CONTRAST
TECHNIQUE: Multidetector CT imaging of the abdomen and pelvis was performed
following the standard protocol without IV contrast.

[Series 2: axial st · axial · 0.82mm/px · z∈[-1007,-587]mm · 15 of 92 slices shown, 19 images]
[im 4/92  soft-tissue]
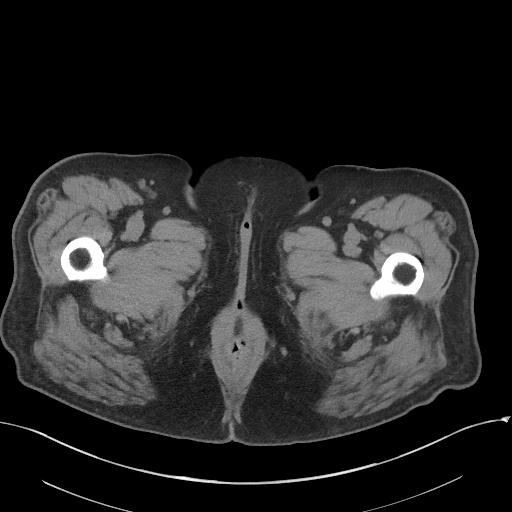
[im 4/92  bone]
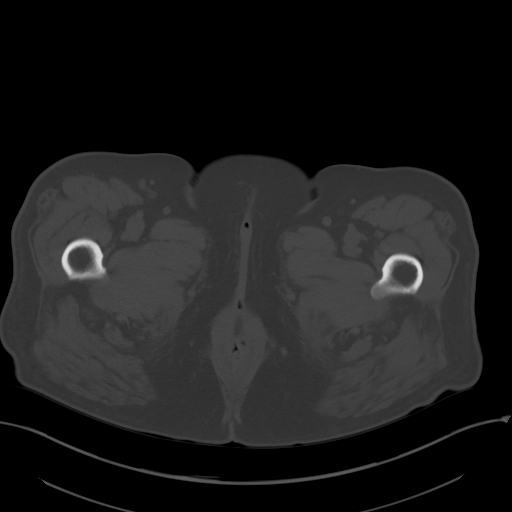
[im 12/92  soft-tissue]
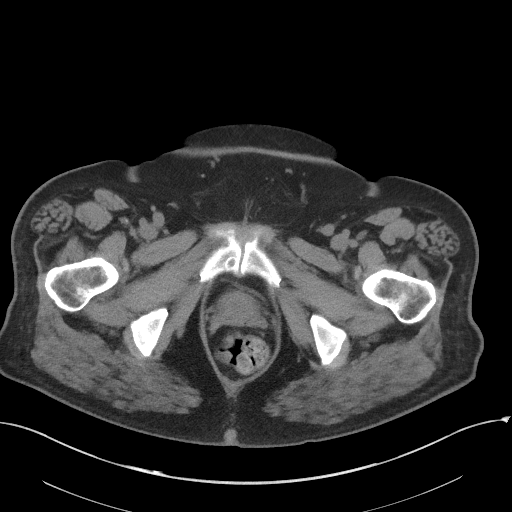
[im 19/92  soft-tissue]
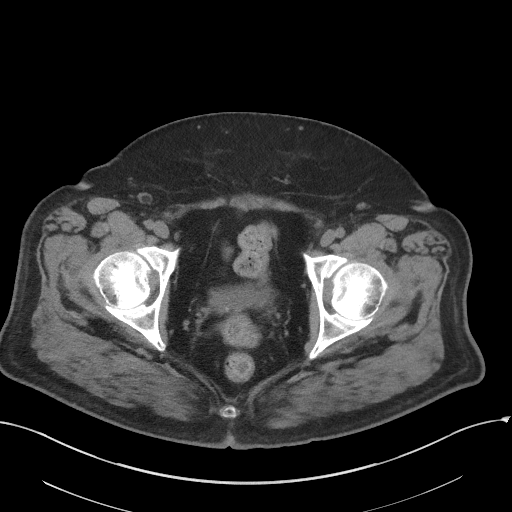
[im 27/92  soft-tissue]
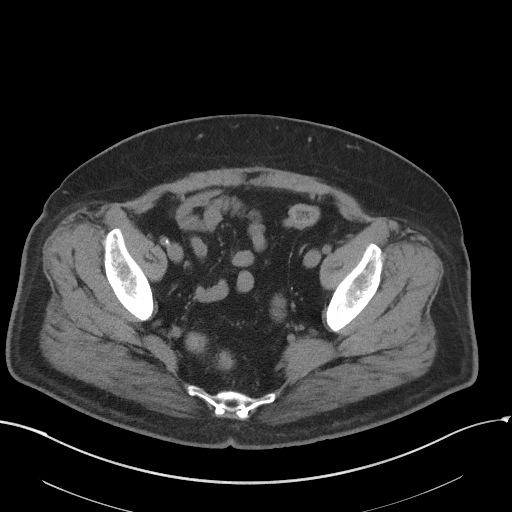
[im 31/92  soft-tissue]
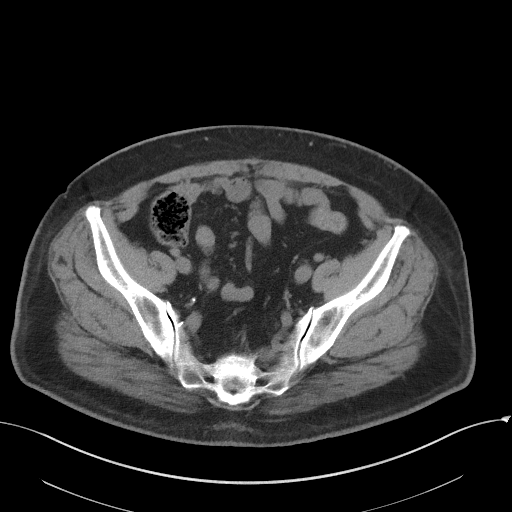
[im 38/92  soft-tissue]
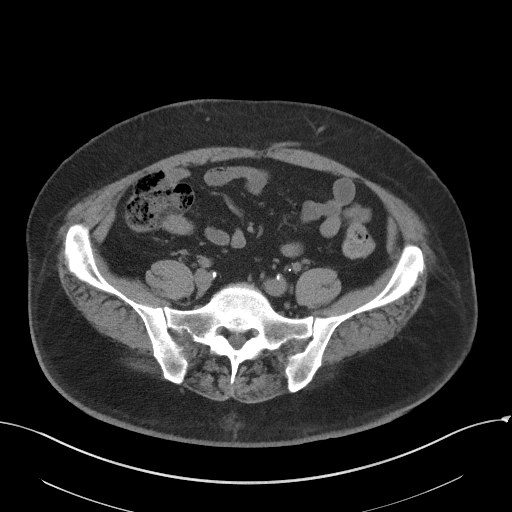
[im 46/92  soft-tissue]
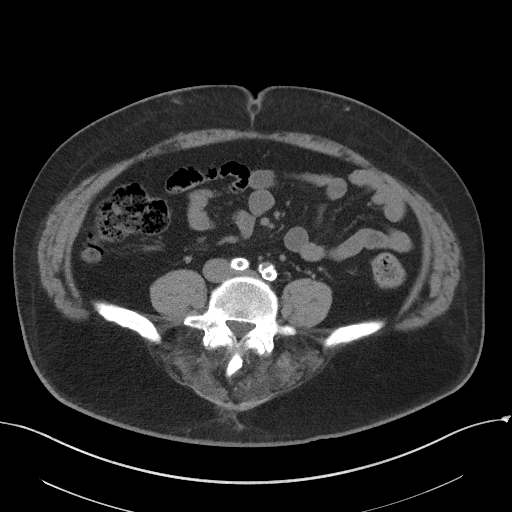
[im 54/92  soft-tissue]
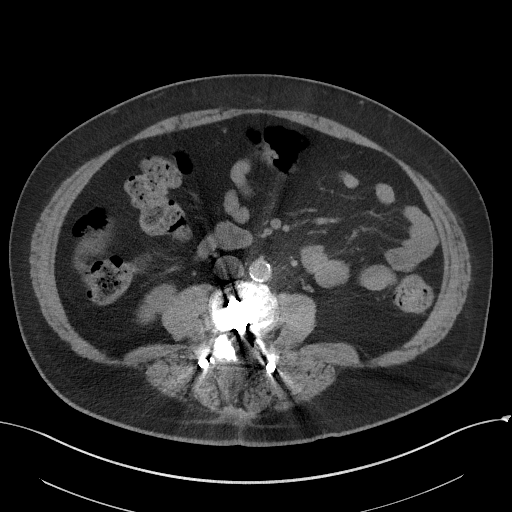
[im 61/92  soft-tissue]
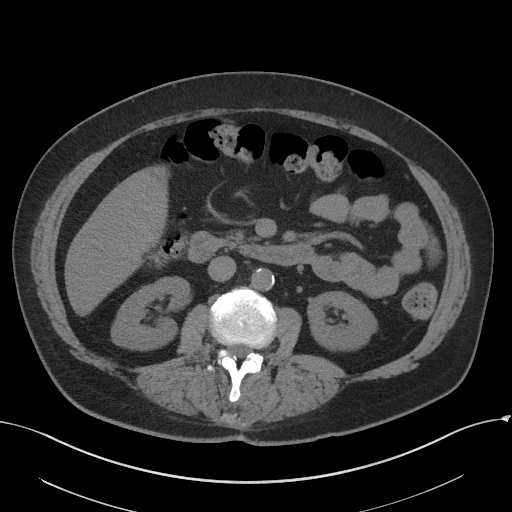
[im 61/92  bone]
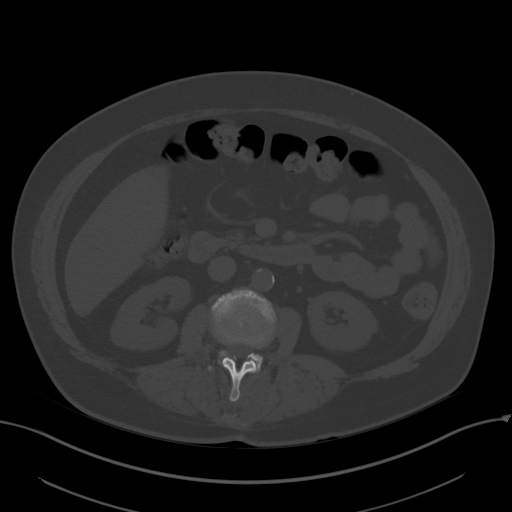
[im 65/92  soft-tissue]
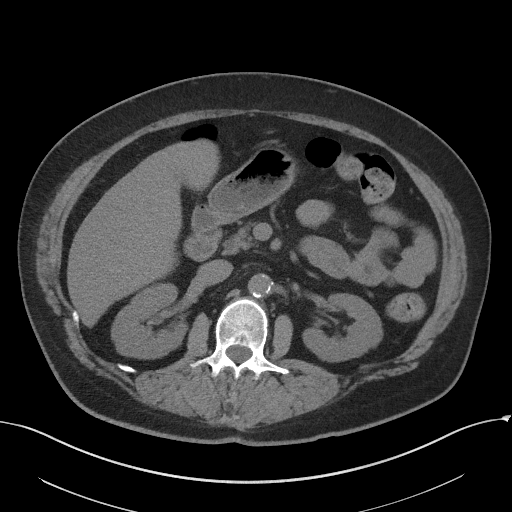
[im 73/92  soft-tissue]
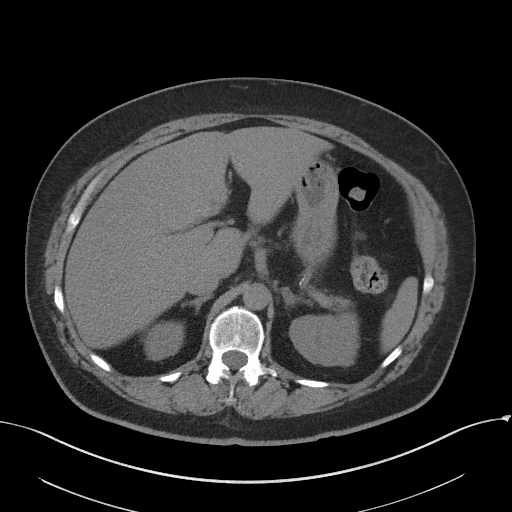
[im 76/92  lung]
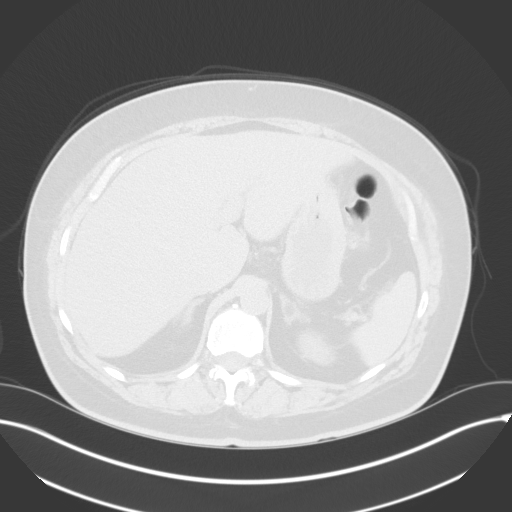
[im 80/92  soft-tissue]
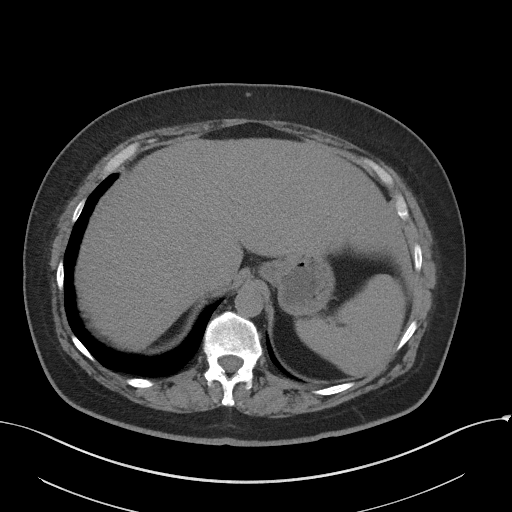
[im 80/92  lung]
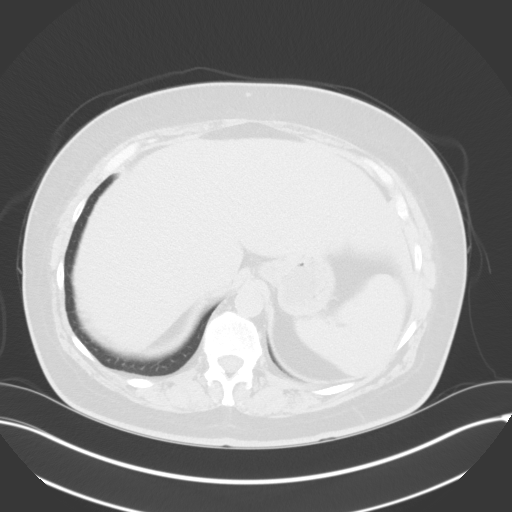
[im 84/92  lung]
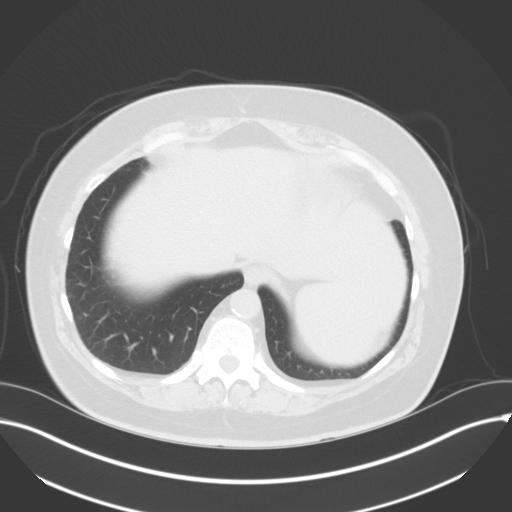
[im 88/92  soft-tissue]
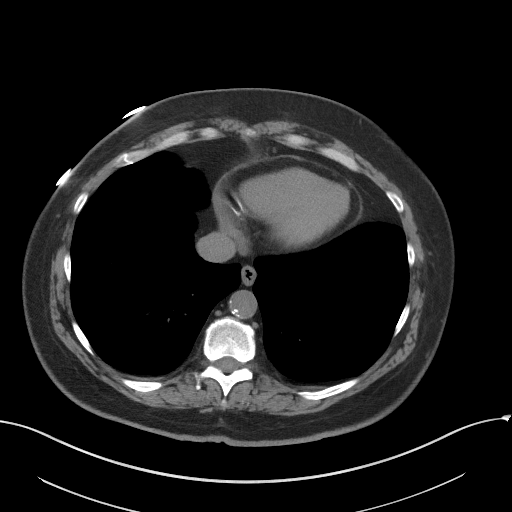
[im 88/92  lung]
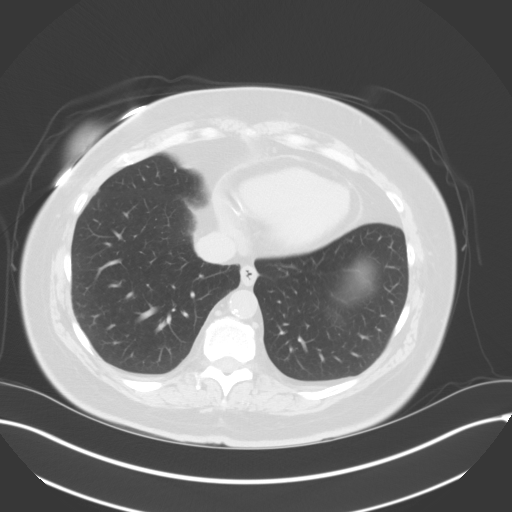

[15 of 32 positions shown; findings below may reference images not displayed]

FINDINGS: Lower chest:  Lung bases are clear.

Hepatobiliary: Mild hepatic steatosis.

Status post cholecystectomy. No intrahepatic or extrahepatic ductal
dilatation.

Pancreas: Mild pancreatic atrophy.

Spleen: Within normal limits.

Adrenals/Urinary Tract: 14 mm left adrenal myelolipoma (series 2/
image 18), benign.

Right adrenal gland is within normal limits.

Kidneys are within normal limits.

No renal, ureteral, or bladder calculi.  No hydronephrosis.

Bladder is underdistended but unremarkable.

Stomach/Bowel: Stomach is within normal limits.

No evidence of bowel obstruction.

Normal appendix (series 2/image 60).

Vascular/Lymphatic: No evidence of abdominal aortic aneurysm.

Atherosclerotic calcifications of the abdominal aorta and branch
vessels.

Aorto bi-iliac stents.

No suspicious abdominopelvic lymphadenopathy.

Reproductive: Status post hysterectomy.

No adnexal masses.

Other: No abdominopelvic ascites.

Musculoskeletal: Status post PLIF at L3-4.

Degenerative changes of the visualized thoracolumbar spine.
IMPRESSION: No renal, ureteral, or bladder calculi.  No hydronephrosis.

14 mm left adrenal myelolipoma, benign.

Mild hepatic steatosis.

Status post cholecystectomy and hysterectomy.

## 2017-05-09 ENCOUNTER — Ambulatory Visit: Admit: 2017-05-09 | Payer: Self-pay | Admitting: Unknown Physician Specialty

## 2017-05-09 SURGERY — COLONOSCOPY WITH PROPOFOL
Anesthesia: General

## 2017-06-27 ENCOUNTER — Telehealth: Payer: Self-pay | Admitting: *Deleted

## 2017-06-27 NOTE — Telephone Encounter (Signed)
Left message for patient to notify them that it is time to schedule annual low dose lung cancer screening CT scan. Instructed patient to call back to verify information prior to the scan being scheduled.  

## 2017-08-01 ENCOUNTER — Telehealth: Payer: Self-pay | Admitting: *Deleted

## 2017-08-01 DIAGNOSIS — Z122 Encounter for screening for malignant neoplasm of respiratory organs: Secondary | ICD-10-CM

## 2017-08-01 DIAGNOSIS — Z87891 Personal history of nicotine dependence: Secondary | ICD-10-CM

## 2017-08-01 NOTE — Telephone Encounter (Signed)
Notified patient that annual lung cancer screening low dose CT scan is due currently or will be in near future. Confirmed that patient is within the age range of 55-77, and asymptomatic, (no signs or symptoms of lung cancer). Patient denies illness that would prevent curative treatment for lung cancer if found. Verified smoking history, (current, 44.5 pack year). The shared decision making visit was done 07/05/16. Patient is agreeable for CT scan being scheduled.

## 2017-08-08 ENCOUNTER — Ambulatory Visit: Admission: RE | Admit: 2017-08-08 | Payer: Medicaid Other | Source: Ambulatory Visit

## 2017-08-10 ENCOUNTER — Ambulatory Visit: Payer: Medicaid Other | Attending: Nurse Practitioner

## 2017-08-14 ENCOUNTER — Telehealth: Payer: Self-pay | Admitting: *Deleted

## 2017-08-14 NOTE — Telephone Encounter (Signed)
Left message for patient to notify them that it is time to schedule annual low dose lung cancer screening CT scan. Instructed patient to call back to verify information prior to the scan being scheduled.  

## 2017-08-17 ENCOUNTER — Telehealth: Payer: Self-pay | Admitting: *Deleted

## 2017-08-17 NOTE — Telephone Encounter (Signed)
2nd attempt to contact pt to r\s missed CT scan appt on 5/31. BF

## 2017-08-20 ENCOUNTER — Telehealth: Payer: Self-pay | Admitting: *Deleted

## 2017-08-20 NOTE — Telephone Encounter (Signed)
Left message for patient to notify them that it is time to schedule annual low dose lung cancer screening CT scan. Instructed patient to call back to verify information prior to the scan being scheduled.  

## 2017-09-06 ENCOUNTER — Other Ambulatory Visit (HOSPITAL_COMMUNITY): Payer: Self-pay | Admitting: Neurology

## 2017-09-06 DIAGNOSIS — M545 Low back pain: Secondary | ICD-10-CM

## 2017-09-21 ENCOUNTER — Encounter: Payer: Self-pay | Admitting: *Deleted

## 2017-10-15 ENCOUNTER — Ambulatory Visit (HOSPITAL_COMMUNITY): Payer: Medicaid Other

## 2017-10-18 ENCOUNTER — Ambulatory Visit (HOSPITAL_COMMUNITY): Payer: Medicaid Other

## 2018-01-02 ENCOUNTER — Other Ambulatory Visit (HOSPITAL_COMMUNITY): Payer: Self-pay | Admitting: Neurology

## 2018-01-02 ENCOUNTER — Ambulatory Visit (HOSPITAL_COMMUNITY)
Admission: RE | Admit: 2018-01-02 | Discharge: 2018-01-02 | Disposition: A | Discharge status: Home / Self Care | Payer: Medicaid Other | Source: Ambulatory Visit | Attending: Neurology | Admitting: Neurology

## 2018-01-02 DIAGNOSIS — M5136 Other intervertebral disc degeneration, lumbar region: Secondary | ICD-10-CM | POA: Diagnosis not present

## 2018-01-02 DIAGNOSIS — Z981 Arthrodesis status: Secondary | ICD-10-CM | POA: Diagnosis not present

## 2018-01-02 DIAGNOSIS — M545 Low back pain, unspecified: Secondary | ICD-10-CM

## 2018-01-02 DIAGNOSIS — M25522 Pain in left elbow: Secondary | ICD-10-CM

## 2018-01-02 DIAGNOSIS — M461 Sacroiliitis, not elsewhere classified: Secondary | ICD-10-CM

## 2018-01-02 DIAGNOSIS — M5137 Other intervertebral disc degeneration, lumbosacral region: Secondary | ICD-10-CM | POA: Diagnosis not present

## 2018-03-11 ENCOUNTER — Other Ambulatory Visit: Payer: Self-pay

## 2018-03-11 ENCOUNTER — Ambulatory Visit
Admission: EM | Admit: 2018-03-11 | Discharge: 2018-03-11 | Disposition: A | Discharge status: Home / Self Care | Payer: Medicaid Other | Attending: Family Medicine | Admitting: Family Medicine

## 2018-03-11 DIAGNOSIS — J441 Chronic obstructive pulmonary disease with (acute) exacerbation: Secondary | ICD-10-CM | POA: Diagnosis not present

## 2018-03-11 DIAGNOSIS — F17210 Nicotine dependence, cigarettes, uncomplicated: Secondary | ICD-10-CM | POA: Diagnosis not present

## 2018-03-11 MED ORDER — DOXYCYCLINE HYCLATE 100 MG PO CAPS: 100.0000 mg | ORAL_CAPSULE | Freq: Two times a day (BID) | ORAL | 0 refills | Status: DC

## 2018-03-11 MED ORDER — HYDROCOD POLST-CPM POLST ER 10-8 MG/5ML PO SUER: 5.0000 mL | Freq: Every evening | ORAL | 0 refills | Status: DC | PRN
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
## 2018-03-11 NOTE — ED Triage Notes (Signed)
Patient states that she has been coughing x 3 weeks. Patient states that cough is only worsening and is concerned that she will cough one of her 7 stents in her heart loose. States that she has tried multiple OTC medications.

## 2018-04-08 ENCOUNTER — Other Ambulatory Visit: Payer: Self-pay

## 2018-04-08 ENCOUNTER — Ambulatory Visit
Admission: EM | Admit: 2018-04-08 | Discharge: 2018-04-08 | Disposition: A | Discharge status: Home / Self Care | Payer: Medicaid Other | Attending: Family Medicine | Admitting: Family Medicine

## 2018-04-08 DIAGNOSIS — R05 Cough: Secondary | ICD-10-CM | POA: Diagnosis present

## 2018-04-08 DIAGNOSIS — R300 Dysuria: Secondary | ICD-10-CM

## 2018-04-08 DIAGNOSIS — R059 Cough, unspecified: Secondary | ICD-10-CM

## 2018-04-08 LAB — URINALYSIS, COMPLETE (UACMP) WITH MICROSCOPIC
Glucose, UA: 250 mg/dL — AB
Hgb urine dipstick: NEGATIVE
Ketones, ur: NEGATIVE mg/dL
LEUKOCYTES UA: NEGATIVE
Nitrite: NEGATIVE
PH: 5 (ref 5.0–8.0)
Protein, ur: NEGATIVE mg/dL
RBC / HPF: NONE SEEN RBC/hpf (ref 0–5)
Squamous Epithelial / LPF: NONE SEEN (ref 0–5)

## 2018-04-08 MED ORDER — CEPHALEXIN 500 MG PO CAPS: 500.0000 mg | ORAL_CAPSULE | Freq: Two times a day (BID) | ORAL | 0 refills | Status: AC

## 2018-04-08 NOTE — Discharge Instructions (Addendum)
Take medication as prescribed. Rest. Drink plenty of fluids. Over the counter cough medication as needed.    Follow up with your primary care physician this week.  We also recommend follow-up with urology. Return to Urgent care for new or worsening concerns.

## 2018-04-08 NOTE — ED Triage Notes (Signed)
Patient states that she has been having urinary frequency and dysuria x 7 weeks. Patient states that chills, lower back pain, fatigue.  Patient states that she was seen here 1 month ago and was given cough medication and she has still has been having a cough. Patient states that she would like cough medication without the pain medicine.
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
## 2018-04-11 LAB — URINE CULTURE

## 2018-04-12 ENCOUNTER — Telehealth (HOSPITAL_COMMUNITY): Payer: Self-pay | Admitting: Emergency Medicine

## 2018-04-12 NOTE — Telephone Encounter (Signed)
Urine culture was positive for e coli and was given keflex  at urgent care visit. Attempted to reach patient. No answer at this time.   

## 2018-06-11 ENCOUNTER — Other Ambulatory Visit: Payer: Self-pay | Admitting: Orthopedic Surgery

## 2018-06-11 DIAGNOSIS — M5441 Lumbago with sciatica, right side: Principal | ICD-10-CM

## 2018-06-11 DIAGNOSIS — M5442 Lumbago with sciatica, left side: Principal | ICD-10-CM

## 2018-06-11 DIAGNOSIS — G8929 Other chronic pain: Secondary | ICD-10-CM

## 2018-10-14 ENCOUNTER — Encounter: Payer: Self-pay | Admitting: Urology

## 2018-10-14 ENCOUNTER — Ambulatory Visit (INDEPENDENT_AMBULATORY_CARE_PROVIDER_SITE_OTHER): Payer: Medicaid Other | Admitting: Urology

## 2018-10-14 ENCOUNTER — Other Ambulatory Visit: Payer: Self-pay

## 2018-10-14 VITALS — BP 113/69 | HR 88 | Ht 65.0 in | Wt 179.0 lb

## 2018-10-14 DIAGNOSIS — N1330 Unspecified hydronephrosis: Secondary | ICD-10-CM | POA: Diagnosis not present

## 2018-10-14 DIAGNOSIS — N3946 Mixed incontinence: Secondary | ICD-10-CM | POA: Diagnosis not present

## 2018-10-14 DIAGNOSIS — N390 Urinary tract infection, site not specified: Secondary | ICD-10-CM

## 2018-10-14 LAB — URINALYSIS, COMPLETE
Bilirubin, UA: NEGATIVE
Nitrite, UA: POSITIVE — AB
RBC, UA: NEGATIVE
Specific Gravity, UA: 1.02 (ref 1.005–1.030)
Urobilinogen, Ur: 0.2 mg/dL (ref 0.2–1.0)
pH, UA: 5 (ref 5.0–7.5)

## 2018-10-14 LAB — BLADDER SCAN AMB NON-IMAGING

## 2018-10-14 LAB — MICROSCOPIC EXAMINATION: RBC, Urine: NONE SEEN /hpf (ref 0–2)

## 2018-10-14 MED ORDER — MIRABEGRON ER 50 MG PO TB24: 50.0000 mg | ORAL_TABLET | Freq: Every day | ORAL | 11 refills | Status: DC

## 2018-10-14 NOTE — Progress Notes (Signed)
10/14/2018 3:00 PM   Autumn Faulkner 23-Feb-1961 [id number]  Referring provider: Maryland Pink, MD [address] [place],  [place] [address]  Chief Complaint  Patient presents with  . Recurrent UTI    HPI: Patient failed Vesicare in 2017 for urge incontinence and bedwetting and did not return.  She has worsening incontinence.  She leaks with coughing and sneezing but not on every occasion and not with bending and lifting.  She has sudden urge incontinence.  She has high-volume bedwetting.  She can leak without awareness.  Urine can run down her leg.  She has no control.  She wears 1 heavy pull-up at night significantly wet and 2 or 3 pads a day that can be moderately wet or soaked  She voids 4-5 times a night and has ankle edema.  She needs vascular surgery and may have 5 previous grafts that are blocked.  She voids every 60 to 90 minutes with a good flow with a can double void a moderate amount  She is an insulin-dependent diabetic.  She has had low back surgery.  She had a positive urine culture in January but I am not convinced she gets bladder infections.  As she did 3 years ago she thinks she has right kidney pain and that the kidney bulges over her belt line  No previous GU surgery.  No kidney stones.  Bowel movements normal.  Does not take Vesicare.  Modifying factors: There are no other modifying factors  Associated signs and symptoms: There are no other associated signs and symptoms Aggravating and relieving factors: There are no other aggravating or relieving factors Severity: Moderate Duration: Persistent   PMH: Past Medical History:  Diagnosis Date  . Chronic back pain   . Chronic shoulder pain   . Diabetes mellitus   . High cholesterol   . High cholesterol   . Neuropathy   . Neuropathy   . Tachycardia     Surgical History: Past Surgical History:  Procedure Laterality Date  . ABDOMINAL HYSTERECTOMY    . BACK SURGERY    . GALLBLADDER  SURGERY    . ILIAC ARTERY STENT    . SHOULDER SURGERY      Home Medications:  Allergies as of 10/14/2018   No Known Allergies     Medication List       Accurate as of October 14, 2018  3:00 PM. If you have any questions, ask your nurse or doctor.        aspirin EC 81 MG tablet Take 81 mg by mouth at bedtime.   atenolol 50 MG tablet Commonly known as: TENORMIN Take 50 mg by mouth daily.   atorvastatin 40 MG tablet Commonly known as: LIPITOR Take 40 mg by mouth daily.   Bydureon 2 MG Pen Generic drug: Exenatide ER INJECT 1 SYRINGE ONCE A WEEK   cetirizine 10 MG tablet Commonly known as: ZYRTEC Take by mouth.   gabapentin 800 MG tablet Commonly known as: NEURONTIN Take 800-1,600 mg by mouth 3 (three) times daily. Takes 800 mg twice a day and 1600 in the evening.   Insulin Degludec 200 UNIT/ML Sopn Inject 120 units once daily. Take in the mornings.   metFORMIN 500 MG tablet Commonly known as: GLUCOPHAGE Take 500 mg by mouth 3 (three) times daily.   mirabegron ER 50 MG Tb24 tablet Commonly known as: MYRBETRIQ Take 1 tablet (50 mg total) by mouth daily. Started by: Reece Packer, MD   oxyCODONE-acetaminophen  10-650 MG tablet Commonly known as: PERCOCET Take 1 tablet by mouth every 6 (six) hours as needed for pain.   Qvar 80 MCG/ACT inhaler Generic drug: beclomethasone Inhale into the lungs.   Semaglutide (1 MG/DOSE) 2 MG/1.5ML Sopn Inject into the skin.   sertraline 100 MG tablet Commonly known as: ZOLOFT Take by mouth.   Valium 5 MG tablet Generic drug: diazepam Take 5 mg by mouth every 6 (six) hours as needed for anxiety.   Vitamin B-12 1000 MCG Subl Place 1,000 mcg under the tongue daily.       Allergies: No Known Allergies  Family History: Family History  Problem Relation Age of Onset  . Hypertension Mother   . Diabetes Mother     Social History:  reports that she has been smoking cigarettes. She has a 44.00 pack-year smoking  history. She uses smokeless tobacco. She reports current alcohol use. She reports that she does not use drugs.  ROS: UROLOGY Frequent Urination?: No Hard to postpone urination?: Yes Burning/pain with urination?: No Get up at night to urinate?: Yes Leakage of urine?: Yes Urine stream starts and stops?: No Trouble starting stream?: No Do you have to strain to urinate?: No Blood in urine?: Yes Urinary tract infection?: Yes Sexually transmitted disease?: No Injury to kidneys or bladder?: No Painful intercourse?: No Weak stream?: No Currently pregnant?: No Vaginal bleeding?: No Last menstrual period?: n  Gastrointestinal Nausea?: No Vomiting?: No Indigestion/heartburn?: No Diarrhea?: No Constipation?: No  Constitutional Fever: No Night sweats?: No Weight loss?: No Fatigue?: No  Skin Skin rash/lesions?: No Itching?: No  Eyes Blurred vision?: No Double vision?: No  Ears/Nose/Throat Sore throat?: No Sinus problems?: No  Hematologic/Lymphatic Swollen glands?: No Easy bruising?: No  Cardiovascular Leg swelling?: No Chest pain?: No  Respiratory Cough?: No Shortness of breath?: No  Endocrine Excessive thirst?: No  Musculoskeletal Back pain?: No Joint pain?: No  Neurological Headaches?: No Dizziness?: No  Psychologic Depression?: No Anxiety?: No  Physical Exam: BP 113/69   Pulse 88   Ht 5\' 5"  (1.651 m)   Wt 179 lb (81.2 kg)   BMI 29.79 kg/m   Constitutional:  Alert and oriented, No acute distress. HEENT: [place] AT, moist mucus membranes.  Trachea midline, no masses. Cardiovascular: No clubbing, cyanosis, or edema. Respiratory: Normal respiratory effort, no increased work of breathing. GI: Abdomen is soft, nontender, nondistended, no abdominal masses GU: No CVA tenderness.  Mild grade 2 hyper mobility the bladder neck and a mild positive cough test. Skin: No rashes, bruises or suspicious lesions. Lymph: No cervical or inguinal adenopathy.  Neurologic: Grossly intact, no focal deficits, moving all 4 extremities. Psychiatric: Normal mood and affect.  Laboratory Data: Lab Results  Component Value Date   WBC 14.8 (H) 10/31/2016   HGB 15.7 10/31/2016   HCT 45.6 10/31/2016   MCV 88.7 10/31/2016   PLT 248 10/31/2016    Lab Results  Component Value Date   CREATININE 0.75 10/31/2016    No results found for: PSA  No results found for: TESTOSTERONE  Lab Results  Component Value Date   HGBA1C 10.3 (H) 12/14/2012    Urinalysis    Component Value Date/Time   COLORURINE YELLOW 04/08/2018 1635   APPEARANCEUR CLEAR 04/08/2018 1635   APPEARANCEUR Clear 09/27/2015 1602   LABSPEC >1.030 (H) 04/08/2018 1635   PHURINE 5.0 04/08/2018 1635   GLUCOSEU 250 (A) 04/08/2018 1635   HGBUR NEGATIVE 04/08/2018 1635   BILIRUBINUR SMALL (A) 04/08/2018 1635   BILIRUBINUR Negative 09/27/2015  [place] 04/08/2018 [place] 04/08/2018 1635   UROBILINOGEN 0.2 12/14/2012 1652   NITRITE NEGATIVE 04/08/2018 1635   LEUKOCYTESUR NEGATIVE 04/08/2018 1635   LEUKOCYTESUR Negative 09/27/2015 1602    Pertinent Imaging:    Assessment & Plan: Patient has mixed incontinence, bedwetting, and leakage without awareness.  She has vague back pain and I will call if the renal ultrasound in 1 week is abnormal.  She understands the role of urodynamics but they will be ordered next time if she does not reach her treatment goal with Myrbetriq 50 mg samples and prescription.  Reevaluate in 8 weeks and hopefully by then she has had vascular surgery and she has improved from the standpoint.  1. Recurrent UTI  - Urinalysis, Complete - Bladder Scan (Post Void Residual) in office - Urine culture  2. Hydronephrosis, unspecified hydronephrosis type  - Ultrasound renal complete; Future   Return in about 8 weeks (around 12/09/2018) for MD follow up.  Reece Packer, MD  [facility] [address] [place], [place] [address] [phone]

## 2018-10-18 LAB — URINE CULTURE

## 2018-10-21 ENCOUNTER — Telehealth: Payer: Self-pay | Admitting: Family Medicine

## 2018-10-21 MED ORDER — NITROFURANTOIN MACROCRYSTAL 100 MG PO CAPS: 100.0000 mg | ORAL_CAPSULE | Freq: Two times a day (BID) | ORAL | 0 refills | Status: DC

## 2018-10-21 NOTE — Telephone Encounter (Signed)
-----   Message from Bjorn Loser, MD sent at 10/21/2018  3:04 PM EDT ----- Macrodantin 100 mg twice a day for 7 days        ----- Message ----- From: Kyra Manges, CMA Sent: 10/18/2018   8:43 AM EDT To: Bjorn Loser, MD   ----- Message ----- From: Interface, [facility] Lab Results In Sent: 10/14/2018   4:36 PM EDT To: Rowe [facility]

## 2018-10-21 NOTE — Telephone Encounter (Signed)
Patient notified and ABX sent to pharmacy.  

## 2018-11-19 ENCOUNTER — Emergency Department
Admission: EM | Admit: 2018-11-19 | Discharge: 2018-11-19 | Disposition: A | Discharge status: Home / Self Care | Payer: Medicaid Other | Attending: Emergency Medicine | Admitting: Emergency Medicine

## 2018-11-19 ENCOUNTER — Encounter: Payer: Self-pay | Admitting: Emergency Medicine

## 2018-11-19 DIAGNOSIS — Z79899 Other long term (current) drug therapy: Secondary | ICD-10-CM | POA: Insufficient documentation

## 2018-11-19 DIAGNOSIS — E119 Type 2 diabetes mellitus without complications: Secondary | ICD-10-CM | POA: Insufficient documentation

## 2018-11-19 DIAGNOSIS — K047 Periapical abscess without sinus: Secondary | ICD-10-CM | POA: Diagnosis not present

## 2018-11-19 DIAGNOSIS — E1140 Type 2 diabetes mellitus with diabetic neuropathy, unspecified: Secondary | ICD-10-CM | POA: Insufficient documentation

## 2018-11-19 DIAGNOSIS — K0889 Other specified disorders of teeth and supporting structures: Secondary | ICD-10-CM | POA: Diagnosis present

## 2018-11-19 DIAGNOSIS — Z7984 Long term (current) use of oral hypoglycemic drugs: Secondary | ICD-10-CM | POA: Diagnosis not present

## 2018-11-19 DIAGNOSIS — F17210 Nicotine dependence, cigarettes, uncomplicated: Secondary | ICD-10-CM | POA: Insufficient documentation

## 2018-11-19 DIAGNOSIS — Z7982 Long term (current) use of aspirin: Secondary | ICD-10-CM | POA: Diagnosis not present

## 2018-11-19 MED ORDER — AMOXICILLIN 500 MG PO CAPS
500.0000 mg | ORAL_CAPSULE | Freq: Once | ORAL | Status: AC
  Administered 2018-11-19: 500 mg via ORAL
  Filled 2018-11-19: qty 1

## 2018-11-19 MED ORDER — AMOXICILLIN 875 MG PO TABS: 875.0000 mg | ORAL_TABLET | Freq: Two times a day (BID) | ORAL | 0 refills | Status: AC
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
## 2018-11-19 NOTE — Discharge Instructions (Signed)
OPTIONS FOR DENTAL FOLLOW UP CARE ° °[place] Department of Health and Human Services - Local Safety Net Dental Clinics °http://www.ncdhhs.gov/dph/oralhealth/services/safetynetclinics.htm °  °[facility] (336-562-3123) ° °[facility] ([phone]) ° °[facility] ([phone] ext [phone]) ° °[facility] ([phone]) ° °[facility] ([phone]) °This clinic caters to the indigent population and is on a lottery system. °Location: °[facility], [address], [place] °Clinic Hours: °Wednesdays from 6pm - 9pm, patients seen by a lottery system. °For dates, call or go to www.med.[facility].edu/[facility]/patients/Dental-[facility] °Services: °Cleanings, fillings and simple extractions. °Payment Options: °DENTAL WORK IS FREE OF CHARGE. Bring proof of income or support. °Best way to get seen: °Arrive at 5:15 pm - this is a lottery, NOT first come/first serve, so arriving earlier will not increase your chances of being seen. °  °  °[facility] °[phone] °Select option 1 for emergencies °  °Location: °[facility], [address], [place] °Clinic Hours: °No walk-ins accepted - call the day before to schedule an appointment. °Check in times are 9:30 am and 1:30 pm. °Services: °Simple extractions, temporary fillings, pulpectomy/pulp debridement, uncomplicated abscess drainage. °Payment Options: °PAYMENT IS DUE AT THE TIME OF SERVICE.  Fee is usually $100-200, additional surgical procedures (e.g. abscess drainage) may be extra. °Cash, checks, Visa/MasterCard accepted.  Can file Medicaid if patient is covered for dental - patient should call case worker to check. °No discount for [facility] patients. °Best way to get seen: °MUST call the day before and get onto the schedule. Can usually be seen the next 1-2 days. No walk-ins accepted. °  °  °[facility] °[phone] °   °Location: °[facility], [address], [place] °Clinic Hours: °M, W, Th, F 8am or 1:30pm, Tues 9a or 1:30 - first come/first served. °Services: °Simple extractions, temporary fillings, uncomplicated abscess drainage.  You do not need to be an [place] resident. °Payment Options: °PAYMENT IS DUE AT THE TIME OF SERVICE. °Dental insurance, otherwise sliding scale - bring proof of income or support. °Depending on income and treatment needed, cost is usually $50-200. °Best way to get seen: °Arrive early as it is first come/first served. °  °  °[facility] °[phone] °  °Location: °[address] °Clinic Hours: °Mon-Thu 8a-5p °Services: °Most basic dental services including extractions and fillings. °Payment Options: °PAYMENT IS DUE AT THE TIME OF SERVICE. °Sliding scale, up to 50% off - bring proof if income or support. °Medicaid with dental option accepted. °Best way to get seen: °Call to schedule an appointment, can usually be seen within 2 weeks OR they will try to see walk-ins - show up at 8a or 2p (you may have to wait). °  °  °[facility] °919-245-2435 °[place] RESIDENTS ONLY °  °Location: °[facility], [address], [place], [place] [address] °Clinic Hours: By appointment only. °Monday - Thursday 8am-5pm, Friday 8am-12pm °Services: Cleanings, fillings, extractions. °Payment Options: °PAYMENT IS DUE AT THE TIME OF SERVICE. °Cash, Visa or MasterCard. Sliding scale - $30 minimum per service. °Best way to get seen: °Come in to office, complete packet and make an appointment - need proof of income °or support monies for each household member and proof of [place] residence. °Usually takes about a month to get in. °  °  °[facility] °[phone] °  °Location: °[address]   [place] °Clinic Hours: Walk-in Urgent Care Dental Services are offered Monday-Friday  mornings only. °The numbers of emergencies accepted daily is limited to the number of °providers available. °Maximum 15 - Mondays, Wednesdays & Thursdays °Maximum 10 - Tuesdays & Fridays °Services: °You do not need to be a [place] resident to be seen for a dental emergency. °Emergencies are defined as pain, swelling, abnormal bleeding, or dental trauma. Walkins will receive x-rays if needed. °NOTE: Dental cleaning is not an emergency. °Payment Options: °PAYMENT IS DUE AT THE TIME OF SERVICE. °Minimum co-pay is $40.00 for uninsured patients. °Minimum co-pay is $3.00 for Medicaid with dental coverage. °Dental Insurance is accepted and must be presented at time of visit. °Medicare does not cover dental. °Forms of payment: Cash, credit card, checks. °Best way to get seen: °If not previously registered with the clinic, walk-in dental registration begins at 7:15 am and is on a first come/first serve basis. °If previously registered with the clinic, call to make an appointment. °  °  °[facility] °[phone] °[place] RESIDENTS ONLY °  °Location: °[address], [place], [place] °Clinic Hours: °Mon-Thu 10a-2p °Services: Extractions only! °Payment Options: °FREE (donations accepted) - bring proof of income or support °Best way to get seen: °Call and schedule an appointment OR come at 8am on the 1st Monday of every month (except for holidays) when it is first come/first served. °  °  °[facility] °[phone] °  °Location: °[address], [place] °Clinic Hours: °Friday mornings °Services, Payment Options, Best way to get seen: °Call for info °

## 2018-11-19 NOTE — ED Triage Notes (Signed)
Pt with multiple dental complaints. Pt reports missing teeth and pain on the bottom left of mouth and the upper right. Pt speaking low and reports she took percocet before coming into ED as well as 1g Tylenol.

## 2018-12-09 ENCOUNTER — Ambulatory Visit: Payer: Medicaid Other | Admitting: Urology

## 2019-01-28 ENCOUNTER — Other Ambulatory Visit: Payer: Self-pay

## 2019-01-28 ENCOUNTER — Ambulatory Visit (INDEPENDENT_AMBULATORY_CARE_PROVIDER_SITE_OTHER): Payer: Medicaid Other | Admitting: Physician Assistant

## 2019-01-28 ENCOUNTER — Encounter: Payer: Self-pay | Admitting: Physician Assistant

## 2019-01-28 ENCOUNTER — Other Ambulatory Visit: Payer: Self-pay | Admitting: Physician Assistant

## 2019-01-28 VITALS — BP 121/77 | HR 94 | Ht 65.0 in | Wt 171.0 lb

## 2019-01-28 DIAGNOSIS — R3989 Other symptoms and signs involving the genitourinary system: Secondary | ICD-10-CM

## 2019-01-28 DIAGNOSIS — N3000 Acute cystitis without hematuria: Secondary | ICD-10-CM

## 2019-01-28 DIAGNOSIS — R300 Dysuria: Secondary | ICD-10-CM

## 2019-01-28 DIAGNOSIS — N1330 Unspecified hydronephrosis: Secondary | ICD-10-CM

## 2019-01-28 LAB — URINALYSIS, COMPLETE
Bilirubin, UA: NEGATIVE
Nitrite, UA: POSITIVE — AB
Specific Gravity, UA: >1.03 — ABNORMAL HIGH (ref 1.005–1.030)
Urobilinogen, Ur: 1 mg/dL (ref 0.2–1.0)
pH, UA: 5.5 (ref 5.0–7.5)

## 2019-01-28 LAB — MICROSCOPIC EXAMINATION
RBC, Urine: NONE SEEN /hpf (ref 0–2)
WBC, UA: >30 /hpf — AB (ref 0–5)

## 2019-01-28 MED ORDER — NITROFURANTOIN MONOHYD MACRO 100 MG PO CAPS: 100.0000 mg | ORAL_CAPSULE | Freq: Two times a day (BID) | ORAL | 0 refills | Status: AC

## 2019-01-28 NOTE — Progress Notes (Signed)
01/28/2019 11:46 AM   Autumn Faulkner 1960/04/10 [id number]  CC: Dysuria, malodorous urine, frequency, urgency  HPI: Autumn Faulkner is a 58 y.o. female who presents today for evaluation of possible UTI. She is an established [facility] patient who last saw Dr. Matilde Sprang on 10/14/2018 for evaluation of urinary incontinence.  She was recommended to ultimately undergo urodynamics if she did not achieve treatment goal on a trial of Myrbetriq 50 mg daily.  She was scheduled for follow-up with him in late September with plans for renal ultrasound prior.  She did not complete this and subsequently canceled her appointment.  Today, she reports a 2 to 3-week history of dysuria, urgency, frequency, nausea, and vaginal soreness.  She reports pneumaturia and believes there is fecal matter in her urine.  She reports chronic right flank pain that she attributes to her kidney.  She denies fever, chills, vomiting, and gross hematuria.  CT stone study in August 2017 without urinary abnormalities.  Past surgical history significant for cesarean section circa 1988, hysterectomy circa 2004, and cholecystectomy circa 2003.  She also undergoes colonoscopy every 3 years due to polyps, most recently in 2016.  She is overdue for this and has one scheduled in December 2020.  She denies a history of abdominal surgery and inflammatory bowel disease, but states she has numerous GI problems including occasional passage of spherical, fleshy, floating objects per rectum, most recently last week.  She has captured these in the past but has been unable to have them analyzed.  In-office UA today positive for 1+ blood, 1+ protein, nitrites, and 1+ leukocyte esterase; urine microscopy with >30 WBCs/HPF and many bacteria.   PMH: Past Medical History:  Diagnosis Date  . Chronic back pain   . Chronic shoulder pain   . Diabetes mellitus   . High cholesterol   . High cholesterol   . Neuropathy   . Neuropathy   . Tachycardia      Surgical History: Past Surgical History:  Procedure Laterality Date  . ABDOMINAL HYSTERECTOMY    . BACK SURGERY    . GALLBLADDER SURGERY    . ILIAC ARTERY STENT    . SHOULDER SURGERY      Home Medications:  Allergies as of 01/28/2019   No Known Allergies     Medication List       Accurate as of January 28, 2019 11:46 AM. If you have any questions, ask your nurse or doctor.        aspirin EC 81 MG tablet Take 81 mg by mouth at bedtime.   atenolol 50 MG tablet Commonly known as: TENORMIN Take 50 mg by mouth daily.   atorvastatin 40 MG tablet Commonly known as: LIPITOR Take 40 mg by mouth daily.   Bydureon 2 MG Pen Generic drug: Exenatide ER INJECT 1 SYRINGE ONCE A WEEK   cetirizine 10 MG tablet Commonly known as: ZYRTEC Take by mouth.   gabapentin 800 MG tablet Commonly known as: NEURONTIN Take 800-1,600 mg by mouth 3 (three) times daily. Takes 800 mg twice a day and 1600 in the evening.   Insulin Degludec 200 UNIT/ML Sopn Inject 120 units once daily. Take in the mornings.   metFORMIN 500 MG tablet Commonly known as: GLUCOPHAGE Take 500 mg by mouth 3 (three) times daily.   mirabegron ER 50 MG Tb24 tablet Commonly known as: MYRBETRIQ Take 1 tablet (50 mg total) by mouth daily.   nitrofurantoin 100 MG capsule Commonly known as: Macrodantin Take 1 capsule (100 mg  total) by mouth 2 (two) times daily.   oxyCODONE-acetaminophen 10-650 MG tablet Commonly known as: PERCOCET Take 1 tablet by mouth every 6 (six) hours as needed for pain.   Qvar 80 MCG/ACT inhaler Generic drug: beclomethasone Inhale into the lungs.   Semaglutide (1 MG/DOSE) 2 MG/1.5ML Sopn Inject into the skin.   sertraline 100 MG tablet Commonly known as: ZOLOFT Take by mouth.   Valium 5 MG tablet Generic drug: diazepam Take 5 mg by mouth every 6 (six) hours as needed for anxiety.   Vitamin B-12 1000 MCG Subl Place 1,000 mcg under the tongue daily.       Allergies:  No Known  Allergies  Family History: Family History  Problem Relation Age of Onset  . Hypertension Mother   . Diabetes Mother     Social History:   reports that she has been smoking cigarettes. She has a 44.00 pack-year smoking history. She uses smokeless tobacco. She reports current alcohol use. She reports that she does not use drugs.  ROS: UROLOGY Frequent Urination?: Yes Hard to postpone urination?: Yes Burning/pain with urination?: Yes Get up at night to urinate?: Yes Leakage of urine?: Yes Urine stream starts and stops?: No Trouble starting stream?: Yes Do you have to strain to urinate?: No Blood in urine?: No Urinary tract infection?: Yes Sexually transmitted disease?: No Injury to kidneys or bladder?: No Painful intercourse?: No Weak stream?: No Currently pregnant?: No Vaginal bleeding?: No Last menstrual period?: n  Gastrointestinal Nausea?: No Vomiting?: No Indigestion/heartburn?: No Diarrhea?: Yes Constipation?: Yes  Constitutional Fever: No Night sweats?: No Weight loss?: Yes Fatigue?: Yes  Skin Skin rash/lesions?: No Itching?: No  Eyes Blurred vision?: Yes Double vision?: No  Ears/Nose/Throat Sore throat?: No Sinus problems?: No  Hematologic/Lymphatic Swollen glands?: No Easy bruising?: Yes  Cardiovascular Leg swelling?: No Chest pain?: No  Respiratory Cough?: No Shortness of breath?: No  Endocrine Excessive thirst?: No  Musculoskeletal Back pain?: Yes Joint pain?: Yes  Neurological Headaches?: Yes Dizziness?: Yes  Psychologic Depression?: Yes Anxiety?: Yes  Physical Exam: BP 121/77   Pulse 94   Ht 5\' 5"  (1.651 m)   Wt 171 lb (77.6 kg)   BMI 28.46 kg/m   Constitutional:  Alert and oriented, no acute distress, nontoxic appearing HEENT: [place], AT Cardiovascular: No clubbing, cyanosis, or edema Respiratory: Normal respiratory effort, no increased work of breathing Skin: No rashes, bruises or suspicious lesions Neurologic:  Grossly intact, no focal deficits, moving all 4 extremities Psychiatric: Anxious mood and affect  Laboratory Data: Results for orders placed or performed in visit on 01/28/19  Microscopic Examination   URINE  Result Value Ref Range   WBC, UA >30 (A) 0 - 5 /hpf   RBC None seen 0 - 2 /hpf   Epithelial Cells (non renal) 0-10 0 - 10 /hpf   Bacteria, UA Many (A) None seen/Few  Urinalysis, Complete  Result Value Ref Range   Specific Gravity, UA >1.030 (H) 1.005 - 1.030   pH, UA 5.5 5.0 - 7.5   Color, UA Yellow Yellow   Appearance Ur Cloudy (A) Clear   Leukocytes,UA 1+ (A) Negative   Protein,UA 1+ (A) Negative/Trace   Glucose, UA 2+ (A) Negative   Ketones, UA 1+ (A) Negative   RBC, UA 1+ (A) Negative   Bilirubin, UA Negative Negative   Urobilinogen, Ur 1.0 0.2 - 1.0 mg/dL   Nitrite, UA Positive (A) Negative   Microscopic Examination See below:    Assessment & Plan:  1. Dysuria Patient reports a 2 to 3-week history of irritative voiding symptoms. UA consistent with infection. Will send for culture. - Urinalysis, Complete - CULTURE, URINE COMPREHENSIVE  2. Acute cystitis without hematuria Will treat patient with Macrobid today and adjust therapy as indicated per culture. - nitrofurantoin, macrocrystal-monohydrate, (MACROBID) 100 MG capsule; Take 1 capsule (100 mg total) by mouth every 12 (twelve) hours for 5 days.  Dispense: 10 capsule; Refill: 0  3. Hydronephrosis, unspecified hydronephrosis type I counseled patient to schedule renal ultrasound previously recommended by Dr. Matilde Sprang.  No new order needs to be placed today.  She expressed understanding.  I would like her to follow-up with Dr. Matilde Sprang after completion of this.  4. Pneumaturia Patient reports pneumaturia.  She believes she is passing fecal matter in her urine.  She does have a history of abdominal surgery.  I would like her to undergo further evaluation for possible colovesical fistula. CT order placed today, she  will need a nurse visit that morning for catheter placement. - CT Abdomen Pelvis W Contrast   Return in about 6 weeks (around 03/11/2019) for Renal US results with Dr. Matilde Sprang.  Debroah Loop, PA-C  [facility] [address] [place], [place] [address] [phone]

## 2019-01-29 ENCOUNTER — Telehealth: Payer: Self-pay | Admitting: Physician Assistant

## 2019-01-29 NOTE — Telephone Encounter (Addendum)
Called patient to explain that she will require CT abdomen pelvis with contrast for evaluation of possible colovesical fistula given her reports of pneumaturia and fecal matter in her urine.  She will require nurse visit for catheter placement in our office the morning of this study. LMOM to return my call.

## 2019-01-30 NOTE — Telephone Encounter (Signed)
I just spoke with the patient via telephone. I explained that I have ordered her a CT abdomen pelvis with contrast for evaluation of possible colovesical fistula.  I explained that once she has scheduled this, she needs to contact our office to schedule a morning and afternoon nurse visit for placement and subsequent removal of a urinary catheter.  She expressed understanding.

## 2019-02-03 LAB — CULTURE, URINE COMPREHENSIVE

## 2019-03-10 ENCOUNTER — Ambulatory Visit: Payer: Medicaid Other | Admitting: Urology

## 2019-03-11 ENCOUNTER — Other Ambulatory Visit: Payer: Self-pay | Admitting: Family Medicine

## 2019-03-11 DIAGNOSIS — Z1231 Encounter for screening mammogram for malignant neoplasm of breast: Secondary | ICD-10-CM

## 2019-05-05 ENCOUNTER — Ambulatory Visit: Payer: Medicaid Other

## 2019-05-05 ENCOUNTER — Other Ambulatory Visit: Payer: Self-pay

## 2019-05-05 ENCOUNTER — Ambulatory Visit
Admission: EM | Admit: 2019-05-05 | Discharge: 2019-05-05 | Disposition: A | Discharge status: Home / Self Care | Payer: Medicaid Other | Attending: Family Medicine | Admitting: Family Medicine

## 2019-05-05 DIAGNOSIS — M7918 Myalgia, other site: Secondary | ICD-10-CM | POA: Diagnosis not present

## 2019-05-05 LAB — URINALYSIS, COMPLETE (UACMP) WITH MICROSCOPIC
Bacteria, UA: NONE SEEN
Bilirubin Urine: NEGATIVE
Glucose, UA: >1000 mg/dL — AB
Hgb urine dipstick: NEGATIVE
Ketones, ur: NEGATIVE mg/dL
Leukocytes,Ua: NEGATIVE
Nitrite: NEGATIVE
Protein, ur: NEGATIVE mg/dL
Specific Gravity, Urine: >1.03 — ABNORMAL HIGH (ref 1.005–1.030)
pH: 5 (ref 5.0–8.0)

## 2019-05-05 MED ORDER — KETOROLAC TROMETHAMINE 10 MG PO TABS: 10.0000 mg | ORAL_TABLET | Freq: Four times a day (QID) | ORAL | 0 refills | Status: AC | PRN
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
## 2019-05-05 NOTE — Discharge Instructions (Signed)
No evidence of fracture.   Pain medication as directed.  Continue home pain medication.  Take care  Dr. Lacinda Axon

## 2019-05-05 NOTE — ED Triage Notes (Signed)
Fall x 8 days ago after tripping on steps.  C/o pain in right knee, right thigh, up spine into left shoulder/LUE.  Has had previous lumbar spinal fusion and left shoulder surgery. Aleve has provided some relief.

## 2019-05-06 LAB — URINE CULTURE

## 2019-05-12 ENCOUNTER — Other Ambulatory Visit: Payer: Self-pay | Admitting: Radiology

## 2019-05-12 ENCOUNTER — Telehealth: Payer: Self-pay | Admitting: Radiology

## 2019-05-12 ENCOUNTER — Other Ambulatory Visit: Payer: Self-pay | Admitting: Physician Assistant

## 2019-05-12 DIAGNOSIS — N3000 Acute cystitis without hematuria: Secondary | ICD-10-CM

## 2019-05-12 NOTE — Telephone Encounter (Signed)
-----   Message from Debroah Loop, [place] sent at 05/12/2019  9:50 AM EST ----- Regarding: RE: RUS & CT orders I put in the CT order again. She does not need the renal US also--the CT will evaluate her for both. ----- Message ----- From: Ranell Patrick, RN Sent: 05/09/2019   8:35 AM EST To: Debroah Loop, PA-C Subject: FW: RUS & CT orders                            It looks like they were closed because she was called multiple times and never called back to schedule them. ----- Message ----- From: Ranell Patrick, RN Sent: 05/08/2019   4:54 PM EST To: Debroah Loop, PA-C Subject: RUS & CT orders                                This patient called saying she needs to schedule the RUS & CT that you discussed with her but it looks like the orders have been closed. Can you put them back in please?

## 2019-05-13 ENCOUNTER — Ambulatory Visit
Admission: RE | Admit: 2019-05-13 | Discharge: 2019-05-13 | Disposition: A | Discharge status: Home / Self Care | Payer: Medicaid Other | Source: Ambulatory Visit | Attending: Family Medicine | Admitting: Family Medicine

## 2019-05-13 DIAGNOSIS — Z1231 Encounter for screening mammogram for malignant neoplasm of breast: Secondary | ICD-10-CM | POA: Insufficient documentation

## 2019-05-14 ENCOUNTER — Encounter: Payer: Self-pay | Admitting: Radiology

## 2019-05-14 ENCOUNTER — Telehealth: Payer: Self-pay | Admitting: Radiology

## 2019-05-14 NOTE — Telephone Encounter (Signed)
Unable to reach patient by phone to schedule CT ordered by Thomas Hoff. Unable to [facility] as voicemail is full. Patient previously stated she had postponed scheduling but would like to now.

## 2019-05-20 ENCOUNTER — Ambulatory Visit: Payer: Medicaid Other | Admitting: Physician Assistant

## 2019-05-20 ENCOUNTER — Other Ambulatory Visit: Payer: Self-pay | Admitting: Radiology

## 2019-05-20 DIAGNOSIS — R300 Dysuria: Secondary | ICD-10-CM

## 2019-05-21 ENCOUNTER — Other Ambulatory Visit: Payer: Self-pay

## 2019-05-21 ENCOUNTER — Encounter: Payer: Self-pay | Admitting: Physician Assistant

## 2019-05-21 ENCOUNTER — Ambulatory Visit (INDEPENDENT_AMBULATORY_CARE_PROVIDER_SITE_OTHER): Payer: Medicaid Other | Admitting: Physician Assistant

## 2019-05-21 VITALS — BP 135/79 | HR 87 | Ht 65.0 in | Wt 167.0 lb

## 2019-05-21 DIAGNOSIS — R350 Frequency of micturition: Secondary | ICD-10-CM | POA: Diagnosis not present

## 2019-05-21 DIAGNOSIS — R300 Dysuria: Secondary | ICD-10-CM | POA: Diagnosis not present

## 2019-05-21 LAB — BLADDER SCAN AMB NON-IMAGING: Scan Result: 22

## 2019-05-21 NOTE — Progress Notes (Signed)
05/21/2019 3:55 PM   Cortny Gertie Baron 08-04-1960 [id number]  CC: Frequency, malodorous urine  HPI: Autumn Faulkner is a 59 y.o. female who presents today for evaluation of possible UTI. She is an established [facility] patient who last saw me on 01/28/2019 for the same.  She reported pneumaturia and possible fecaluria at that visit and was recommended to undergo CT cystogram for evaluation of a possible colovesical fistula.  She deferred this and is now scheduled to proceed with the study in 2 days.  Urine culture from that visit resulted with Augmentin and cefuroxime-resistant Klebsiella aerogenes; she was treated with culture appropriate Macrobid.  Today, she reports an approximate 2-week history of urinary frequency, malodorous urine, and 2 episodes of seeing diarrhea "out of the pee hole."  Patient is uncomfortable appearing in clinic today.  She is lying supine at the beginning of our visit, reporting that that is the only position in which she can get relief from severe left shoulder and elbow pain.  She reports recent evaluation by orthopedics for this and states she is concerned that the imaging obtained was inadequate for complete evaluation of her problem. She is a bit distracted by the pain, complicating our interview.  In-office UA today positive for trace glucose; urine microscopy pan-negative. PVR 3mL.  PMH: Past Medical History:  Diagnosis Date  . Chronic back pain   . Chronic shoulder pain   . Diabetes mellitus   . High cholesterol   . High cholesterol   . Neuropathy   . Neuropathy   . Tachycardia     Surgical History: Past Surgical History:  Procedure Laterality Date  . ABDOMINAL HYSTERECTOMY    . BACK SURGERY    . GALLBLADDER SURGERY    . ILIAC ARTERY STENT    . SHOULDER SURGERY      Home Medications:  Allergies as of 05/21/2019   No Known Allergies     Medication List       Accurate as of May 21, 2019  3:55 PM. If you have any questions, ask your nurse  or doctor.        aspirin EC 81 MG tablet Take 81 mg by mouth at bedtime.   atenolol 50 MG tablet Commonly known as: TENORMIN Take 50 mg by mouth daily.   atorvastatin 40 MG tablet Commonly known as: LIPITOR Take 40 mg by mouth daily.   Bydureon 2 MG Pen Generic drug: Exenatide ER INJECT 1 SYRINGE ONCE A WEEK   cetirizine 10 MG tablet Commonly known as: ZYRTEC Take by mouth.   gabapentin 800 MG tablet Commonly known as: NEURONTIN Take 800-1,600 mg by mouth 3 (three) times daily. Takes 800 mg twice a day and 1600 in the evening.   insulin degludec 200 UNIT/ML FlexTouch Pen Commonly known as: TRESIBA Inject 120 units once daily. Take in the mornings.   ketorolac 10 MG tablet Commonly known as: TORADOL Take 1 tablet (10 mg total) by mouth every 6 (six) hours as needed for moderate pain or severe pain.   metFORMIN 500 MG tablet Commonly known as: GLUCOPHAGE Take 500 mg by mouth 3 (three) times daily.   oxyCODONE-acetaminophen 10-650 MG tablet Commonly known as: PERCOCET Take 1 tablet by mouth every 6 (six) hours as needed for pain.   Qvar 80 MCG/ACT inhaler Generic drug: beclomethasone Inhale into the lungs.   Semaglutide (1 MG/DOSE) 2 MG/1.5ML Sopn Inject into the skin.   sertraline 100 MG tablet Commonly known as: ZOLOFT Take by mouth.  Valium 5 MG tablet Generic drug: diazepam Take 5 mg by mouth every 6 (six) hours as needed for anxiety.   Vitamin B-12 1000 MCG Subl Place 1,000 mcg under the tongue daily.       Allergies:  No Known Allergies  Family History: Family History  Problem Relation Age of Onset  . Hypertension Mother   . Diabetes Mother   . Aneurysm Mother   . Thyroid disease Mother   . Asthma Father   . Breast cancer Paternal Aunt     Social History:   reports that she has been smoking cigarettes. She has a 22.00 pack-year smoking history. She uses smokeless tobacco. She reports current alcohol use. She reports that she does not  use drugs.  Physical Exam: BP 135/79   Pulse 87   Ht 5\' 5"  (1.651 m)   Wt 167 lb (75.8 kg)   BMI 27.79 kg/m   Constitutional:  Alert and oriented, see HPI HEENT: [place], AT Cardiovascular: No clubbing, cyanosis, or edema Respiratory: Normal respiratory effort, no increased work of breathing Skin: No rashes, bruises or suspicious lesions Neurologic: Grossly intact, no focal deficits, moving all 4 extremities  Laboratory Data: Results for orders placed or performed in visit on 05/21/19  Microscopic Examination   URINE  Result Value Ref Range   WBC, UA 0-5 0 - 5 /hpf   RBC None seen 0 - 2 /hpf   Epithelial Cells (non renal) 0-10 0 - 10 /hpf   Bacteria, UA None seen None seen/Few  Urinalysis, Complete  Result Value Ref Range   Specific Gravity, UA 1.025 1.005 - 1.030   pH, UA 5.0 5.0 - 7.5   Color, UA Yellow Yellow   Appearance Ur Hazy (A) Clear   Leukocytes,UA Negative Negative   Protein,UA Negative Negative/Trace   Glucose, UA Trace (A) Negative   Ketones, UA Negative Negative   RBC, UA Negative Negative   Bilirubin, UA Negative Negative   Urobilinogen, Ur 0.2 0.2 - 1.0 mg/dL   Nitrite, UA Negative Negative   Microscopic Examination See below:   Bladder Scan (Post Void Residual) in office  Result Value Ref Range   Scan Result 22    Assessment & Plan:   1. Urinary frequency UA reassuring for infection, will send for culture to confirm.  We will proceed with plans for CT cystogram for evaluation of possible colovesical fistula in 2 days given her repeated reports of seeing stool in her urine, though her negative UA today is reassuring for this pathology. - Urinalysis, Complete - Bladder Scan (Post Void Residual) in office - CULTURE, URINE COMPREHENSIVE   Return if symptoms worsen or fail to improve.  Debroah Loop, PA-C  [facility] [address], [place], [place] [address] [phone]

## 2019-05-22 LAB — URINALYSIS, COMPLETE
Bilirubin, UA: NEGATIVE
Ketones, UA: NEGATIVE
Leukocytes,UA: NEGATIVE
Nitrite, UA: NEGATIVE
Protein,UA: NEGATIVE
RBC, UA: NEGATIVE
Specific Gravity, UA: 1.025 (ref 1.005–1.030)
Urobilinogen, Ur: 0.2 mg/dL (ref 0.2–1.0)
pH, UA: 5 (ref 5.0–7.5)

## 2019-05-22 LAB — MICROSCOPIC EXAMINATION
Bacteria, UA: NONE SEEN
RBC, Urine: NONE SEEN /hpf (ref 0–2)

## 2019-05-23 ENCOUNTER — Ambulatory Visit: Admission: RE | Admit: 2019-05-23 | Payer: Medicaid Other | Source: Ambulatory Visit

## 2019-05-23 ENCOUNTER — Ambulatory Visit: Payer: Medicaid Other | Admitting: Physician Assistant

## 2019-05-24 LAB — CULTURE, URINE COMPREHENSIVE

## 2019-05-29 ENCOUNTER — Ambulatory Visit: Payer: Medicaid Other | Admitting: Physician Assistant

## 2019-07-08 ENCOUNTER — Telehealth: Payer: Self-pay | Admitting: Physician Assistant

## 2019-07-08 NOTE — Telephone Encounter (Signed)
Pt called back she rescheduled her CT scan for 07/16/19  And she needs a new order for the Ultrasound they told her there was nothing in system  She would like for [place] to call her once the order is in so she can schedule it.

## 2019-07-14 ENCOUNTER — Other Ambulatory Visit: Payer: Self-pay | Admitting: Sports Medicine

## 2019-07-14 DIAGNOSIS — M25519 Pain in unspecified shoulder: Secondary | ICD-10-CM

## 2019-07-14 DIAGNOSIS — R29898 Other symptoms and signs involving the musculoskeletal system: Secondary | ICD-10-CM

## 2019-07-14 DIAGNOSIS — M5030 Other cervical disc degeneration, unspecified cervical region: Secondary | ICD-10-CM

## 2019-07-16 ENCOUNTER — Encounter: Payer: Self-pay | Admitting: Urology

## 2019-07-16 ENCOUNTER — Ambulatory Visit: Admission: RE | Admit: 2019-07-16 | Payer: Medicaid Other | Source: Ambulatory Visit

## 2019-07-16 ENCOUNTER — Ambulatory Visit: Payer: Self-pay | Admitting: Urology

## 2019-07-16 ENCOUNTER — Telehealth: Payer: Self-pay | Admitting: Urology

## 2019-07-16 NOTE — Telephone Encounter (Signed)
LMOM for patient to return call and schedule appointment to get catheter placed in [facility] before she goes to [facility] to have CT scan on May 12.

## 2019-07-16 NOTE — Telephone Encounter (Signed)
We need to advise Mrs. Thielke that they are not able to place the Foley prior to her cystogram in [place], so she would have to come here to [facility] first and then go to [facility] for the CT.

## 2019-07-17 NOTE — Telephone Encounter (Signed)
Patient notified and per [place] patient is to come in on May 11 to get catheter placed before CT on May 12.

## 2019-07-22 ENCOUNTER — Ambulatory Visit: Payer: Self-pay | Admitting: Urology

## 2019-07-22 ENCOUNTER — Ambulatory Visit: Payer: Medicaid Other

## 2019-07-23 ENCOUNTER — Other Ambulatory Visit: Payer: Medicaid Other

## 2019-07-23 ENCOUNTER — Ambulatory Visit: Admission: RE | Admit: 2019-07-23 | Payer: Medicaid Other | Source: Ambulatory Visit

## 2019-07-25 ENCOUNTER — Telehealth: Payer: Self-pay | Admitting: Urology

## 2019-07-25 NOTE — Telephone Encounter (Signed)
Pt is on the phone.  She needs some meds called in for UTI, if possible.  Her urine smells bad, she's leaking when she coughs and it hurts inside when she has to urinate.  She said she fell off the porch yesterday, is very sore and not sure if she would be able to come in to office.

## 2019-07-25 NOTE — Telephone Encounter (Signed)
Left pt message to call, she will need to come in for a PA office visit for further evaluation

## 2019-07-25 NOTE — Telephone Encounter (Signed)
Patient left voicemail on triage line stating she does not have transportation to get here and wants a ABX called in for her. I called patient again and unable to leave a message, no answer.

## 2019-07-26 ENCOUNTER — Ambulatory Visit: Admission: RE | Admit: 2019-07-26 | Payer: Medicaid Other | Source: Ambulatory Visit

## 2019-08-08 ENCOUNTER — Emergency Department
Admission: EM | Admit: 2019-08-08 | Discharge: 2019-08-08 | Disposition: A | Discharge status: Home / Self Care | Payer: Medicaid Other | Attending: Emergency Medicine | Admitting: Emergency Medicine

## 2019-08-08 ENCOUNTER — Other Ambulatory Visit: Payer: Self-pay

## 2019-08-08 ENCOUNTER — Emergency Department: Payer: Medicaid Other

## 2019-08-08 ENCOUNTER — Encounter: Payer: Self-pay | Admitting: Emergency Medicine

## 2019-08-08 DIAGNOSIS — F17210 Nicotine dependence, cigarettes, uncomplicated: Secondary | ICD-10-CM | POA: Diagnosis not present

## 2019-08-08 DIAGNOSIS — I10 Essential (primary) hypertension: Secondary | ICD-10-CM | POA: Diagnosis not present

## 2019-08-08 DIAGNOSIS — Z794 Long term (current) use of insulin: Secondary | ICD-10-CM | POA: Diagnosis not present

## 2019-08-08 DIAGNOSIS — Z7982 Long term (current) use of aspirin: Secondary | ICD-10-CM | POA: Diagnosis not present

## 2019-08-08 DIAGNOSIS — E119 Type 2 diabetes mellitus without complications: Secondary | ICD-10-CM | POA: Diagnosis not present

## 2019-08-08 DIAGNOSIS — J441 Chronic obstructive pulmonary disease with (acute) exacerbation: Secondary | ICD-10-CM | POA: Diagnosis not present

## 2019-08-08 DIAGNOSIS — Z79899 Other long term (current) drug therapy: Secondary | ICD-10-CM | POA: Diagnosis not present

## 2019-08-08 DIAGNOSIS — R0602 Shortness of breath: Secondary | ICD-10-CM | POA: Diagnosis present

## 2019-08-08 LAB — BASIC METABOLIC PANEL
Anion gap: 8 (ref 5–15)
BUN: 17 mg/dL (ref 6–20)
CO2: 29 mmol/L (ref 22–32)
Calcium: 9.6 mg/dL (ref 8.9–10.3)
Chloride: 101 mmol/L (ref 98–111)
Creatinine, Ser: 0.84 mg/dL (ref 0.44–1.00)
GFR calc Af Amer: >60 mL/min (ref >60)
GFR calc non Af Amer: >60 mL/min (ref >60)
Glucose, Bld: 174 mg/dL — ABNORMAL HIGH (ref 70–99)
Potassium: 4.4 mmol/L (ref 3.5–5.1)
Sodium: 138 mmol/L (ref 135–145)

## 2019-08-08 LAB — CBC
HCT: 44.4 % (ref 36.0–46.0)
Hemoglobin: 14.7 g/dL (ref 12.0–15.0)
MCH: 30.4 pg (ref 26.0–34.0)
MCHC: 33.1 g/dL (ref 30.0–36.0)
MCV: 91.7 fL (ref 80.0–100.0)
Platelets: 290 10*3/uL (ref 150–400)
RBC: 4.84 MIL/uL (ref 3.87–5.11)
RDW: 14.1 % (ref 11.5–15.5)
WBC: 13.2 10*3/uL — ABNORMAL HIGH (ref 4.0–10.5)
nRBC: 0 % (ref 0.0–0.2)

## 2019-08-08 LAB — URINALYSIS, COMPLETE (UACMP) WITH MICROSCOPIC
Bacteria, UA: NONE SEEN
Bilirubin Urine: NEGATIVE
Glucose, UA: NEGATIVE mg/dL
Hgb urine dipstick: NEGATIVE
Ketones, ur: 5 mg/dL — AB
Leukocytes,Ua: NEGATIVE
Nitrite: NEGATIVE
Protein, ur: NEGATIVE mg/dL
Specific Gravity, Urine: 1.026 (ref 1.005–1.030)
pH: 5 (ref 5.0–8.0)

## 2019-08-08 LAB — TROPONIN I (HIGH SENSITIVITY): Troponin I (High Sensitivity): <2 ng/L (ref <18)

## 2019-08-08 MED ORDER — AZITHROMYCIN 250 MG PO TABS: ORAL_TABLET | ORAL | 0 refills | Status: AC

## 2019-08-08 NOTE — ED Notes (Signed)
Pt updated on wait, VS reassessed. Pt upset because she is waiting. Explained to pt that it was difficult to give an exact time but we were working really hard to get some rooms available.

## 2019-08-08 NOTE — ED Notes (Signed)
Patient to stat desk in no acute distress asking about wait time. Patient given update on wait time. Patient verbalizes understanding. Patient states that she is going to step outside for a few minutes.

## 2019-08-08 NOTE — ED Triage Notes (Signed)
Pt here for [facility], chest pain, ear ache, strep throat.  Also thinks she has a UTI.  No fevers.  Unlabored. VSS. Reports someone living with her had respiratory sx and tested negative for covid.  Alert and oriented.

## 2019-08-08 NOTE — Discharge Instructions (Addendum)
Please seek medical attention for any high fevers, chest pain, shortness of breath, change in behavior, persistent vomiting, bloody stool or any other new or concerning symptoms.  
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
## 2019-08-08 NOTE — ED Notes (Signed)
ED Provider at bedside. 

## 2019-10-13 ENCOUNTER — Telehealth: Payer: Self-pay | Admitting: Radiology

## 2019-10-13 DIAGNOSIS — N3000 Acute cystitis without hematuria: Secondary | ICD-10-CM

## 2019-10-13 NOTE — Telephone Encounter (Signed)
Patient is ready to schedule [place] and CT that were previously missed. It looks like she may need a cystogram. Please advise. It looks like the orders for these tests have been cancelled so they will need to be reordered.

## 2019-10-14 NOTE — Telephone Encounter (Signed)
Please contact the patient and inform her of the following:  She does not need both a renal ultrasound and a CT. I have placed an order for the CT only; the imaging department should contact her to schedule this. After she schedules her CT, she must call our clinic to arrange two other appointments to occur on the same day: one appointment before her CT scan to have a Foley catheter placed, and one appointment after her CT scan to have the Foley catheter removed.  She has no-showed to this workup twice before. Please counsel her that if she fails to complete her appointments as explained above, she will be released from our practice and need to seek urologic care elsewhere.

## 2019-10-20 ENCOUNTER — Other Ambulatory Visit: Payer: Self-pay | Admitting: Physician Assistant

## 2019-10-20 DIAGNOSIS — N3000 Acute cystitis without hematuria: Secondary | ICD-10-CM

## 2019-10-20 NOTE — Progress Notes (Signed)
Per radiology, I have cancelled the prior CT AP order and placed a new one today. We will proceed with CT AP with contrast with use of oral OR rectal contrast, not IV. Patient will not require Foley placement for this study.

## 2019-10-21 NOTE — Telephone Encounter (Signed)
LMOM to inform patient that her insurance is out of network and to return call to discuss how she wants to proceed.

## 2019-10-28 ENCOUNTER — Ambulatory Visit: Payer: Medicaid Other | Admitting: Urology

## 2019-10-30 NOTE — Telephone Encounter (Signed)
LMOM to return call.

## 2019-12-06 ENCOUNTER — Telehealth: Payer: Self-pay | Admitting: Nurse Practitioner

## 2019-12-06 DIAGNOSIS — U071 COVID-19: Secondary | ICD-10-CM

## 2019-12-06 NOTE — Telephone Encounter (Signed)
Called to Discuss with patient about Covid symptoms and the use of regeneron, a monoclonal antibody infusion for those with mild to moderate Covid symptoms and at a high risk of hospitalization.     Pt is qualified for this infusion at the [facility] due to co-morbid conditions and/or a member of an at-risk group.     Unable to reach pt. Left message to return call. Sent text message.   Beckey Rutter, [place], AGNP-C [phone] ([place])

## 2019-12-19 ENCOUNTER — Inpatient Hospital Stay: Admission: RE | Admit: 2019-12-19 | Payer: Medicaid Other | Source: Ambulatory Visit

## 2019-12-23 ENCOUNTER — Encounter: Admission: RE | Payer: Self-pay | Source: Home / Self Care

## 2019-12-23 ENCOUNTER — Ambulatory Visit: Admission: RE | Admit: 2019-12-23 | Payer: Medicaid Other | Source: Home / Self Care

## 2019-12-23 SURGERY — ESOPHAGOGASTRODUODENOSCOPY (EGD) WITH PROPOFOL
Anesthesia: General

## 2020-09-10 ENCOUNTER — Other Ambulatory Visit: Payer: Self-pay | Admitting: Family Medicine

## 2020-09-10 DIAGNOSIS — Z1231 Encounter for screening mammogram for malignant neoplasm of breast: Secondary | ICD-10-CM

## 2023-08-29 ENCOUNTER — Other Ambulatory Visit: Payer: Self-pay | Admitting: Urology

## 2023-08-29 DIAGNOSIS — N10 Acute tubulo-interstitial nephritis: Secondary | ICD-10-CM

## 2023-09-06 ENCOUNTER — Inpatient Hospital Stay: Admission: RE | Admit: 2023-09-06 | Source: Ambulatory Visit

## 2024-03-11 ENCOUNTER — Ambulatory Visit: Admitting: Physical Therapy
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
## 2024-03-18 ENCOUNTER — Ambulatory Visit: Admitting: Physical Therapy
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
## 2024-03-20 ENCOUNTER — Ambulatory Visit: Admitting: Physical Therapy

## 2024-03-25 ENCOUNTER — Ambulatory Visit: Admitting: Physical Therapy

## 2024-03-27 ENCOUNTER — Ambulatory Visit: Admitting: Physical Therapy

## 2024-04-01 ENCOUNTER — Ambulatory Visit: Admitting: Physical Therapy

## 2024-04-03 ENCOUNTER — Ambulatory Visit: Admitting: Physical Therapy

## 2024-04-08 ENCOUNTER — Ambulatory Visit: Admitting: Physical Therapy

## 2024-04-10 ENCOUNTER — Ambulatory Visit: Admitting: Physical Therapy

## 2024-04-15 ENCOUNTER — Ambulatory Visit: Admitting: Physical Therapy

## 2024-04-24 ENCOUNTER — Ambulatory Visit: Admitting: Physical Therapy

## 2024-04-29 ENCOUNTER — Ambulatory Visit: Admitting: Physical Therapy

## 2024-05-01 ENCOUNTER — Ambulatory Visit: Admitting: Physical Therapy
